# Patient Record
Sex: Female | Born: 1970
Health system: Southern US, Community
[De-identification: ages and names within clinical notes are randomized; demographics above are authoritative.]

## PROBLEM LIST (undated history)

## (undated) DIAGNOSIS — J45909 Unspecified asthma, uncomplicated: Secondary | ICD-10-CM

## (undated) HISTORY — PX: OTHER SURGICAL HISTORY: SHX169

---

## 1998-03-26 ENCOUNTER — Encounter: Admission: RE | Admit: 1998-03-26 | Discharge: 1998-03-26 | Payer: Self-pay | Admitting: Hematology and Oncology

## 1998-05-04 ENCOUNTER — Encounter: Admission: RE | Admit: 1998-05-04 | Discharge: 1998-05-04 | Payer: Self-pay | Admitting: Internal Medicine

## 1999-09-24 ENCOUNTER — Encounter: Admission: RE | Admit: 1999-09-24 | Discharge: 1999-09-24 | Payer: Self-pay | Admitting: Hematology and Oncology

## 2000-10-26 ENCOUNTER — Encounter (INDEPENDENT_AMBULATORY_CARE_PROVIDER_SITE_OTHER): Payer: Self-pay | Admitting: *Deleted

## 2000-10-26 LAB — CONVERTED CEMR LAB

## 2000-10-30 ENCOUNTER — Encounter: Admission: RE | Admit: 2000-10-30 | Discharge: 2000-10-30 | Payer: Self-pay | Admitting: Family Medicine

## 2000-11-13 ENCOUNTER — Encounter: Admission: RE | Admit: 2000-11-13 | Discharge: 2000-11-13 | Payer: Self-pay | Admitting: Family Medicine

## 2001-02-12 ENCOUNTER — Ambulatory Visit (HOSPITAL_COMMUNITY): Admission: RE | Admit: 2001-02-12 | Discharge: 2001-02-12 | Payer: Self-pay | Admitting: *Deleted

## 2001-02-13 ENCOUNTER — Encounter: Admission: RE | Admit: 2001-02-13 | Discharge: 2001-02-13 | Payer: Self-pay | Admitting: Family Medicine

## 2001-03-16 ENCOUNTER — Encounter: Admission: RE | Admit: 2001-03-16 | Discharge: 2001-03-16 | Payer: Self-pay | Admitting: Family Medicine

## 2001-03-30 ENCOUNTER — Encounter: Admission: RE | Admit: 2001-03-30 | Discharge: 2001-03-30 | Payer: Self-pay | Admitting: Family Medicine

## 2001-04-02 ENCOUNTER — Encounter: Payer: Self-pay | Admitting: Surgical Oncology

## 2001-04-02 ENCOUNTER — Ambulatory Visit (HOSPITAL_COMMUNITY): Admission: RE | Admit: 2001-04-02 | Discharge: 2001-04-02 | Payer: Self-pay | Admitting: Surgical Oncology

## 2001-04-24 ENCOUNTER — Encounter: Admission: RE | Admit: 2001-04-24 | Discharge: 2001-04-24 | Payer: Self-pay | Admitting: Family Medicine

## 2003-10-16 ENCOUNTER — Encounter: Admission: RE | Admit: 2003-10-16 | Discharge: 2003-10-16 | Payer: Self-pay | Admitting: Sports Medicine

## 2004-08-19 ENCOUNTER — Emergency Department (HOSPITAL_COMMUNITY): Admission: EM | Admit: 2004-08-19 | Discharge: 2004-08-19 | Payer: Self-pay | Admitting: Emergency Medicine

## 2004-11-25 ENCOUNTER — Emergency Department (HOSPITAL_COMMUNITY): Admission: EM | Admit: 2004-11-25 | Discharge: 2004-11-25 | Payer: Self-pay | Admitting: Family Medicine

## 2005-11-26 ENCOUNTER — Emergency Department (HOSPITAL_COMMUNITY): Admission: EM | Admit: 2005-11-26 | Discharge: 2005-11-26 | Payer: Self-pay | Admitting: Emergency Medicine

## 2006-11-15 ENCOUNTER — Emergency Department (HOSPITAL_COMMUNITY): Admission: EM | Admit: 2006-11-15 | Discharge: 2006-11-15 | Payer: Self-pay | Admitting: Family Medicine

## 2007-02-23 ENCOUNTER — Encounter (INDEPENDENT_AMBULATORY_CARE_PROVIDER_SITE_OTHER): Payer: Self-pay | Admitting: *Deleted

## 2007-07-31 ENCOUNTER — Emergency Department (HOSPITAL_COMMUNITY): Admission: EM | Admit: 2007-07-31 | Discharge: 2007-07-31 | Payer: Self-pay | Admitting: Emergency Medicine

## 2007-09-21 ENCOUNTER — Emergency Department (HOSPITAL_COMMUNITY): Admission: EM | Admit: 2007-09-21 | Discharge: 2007-09-22 | Payer: Self-pay | Admitting: Emergency Medicine

## 2008-03-14 ENCOUNTER — Inpatient Hospital Stay (HOSPITAL_COMMUNITY): Admission: RE | Admit: 2008-03-14 | Discharge: 2008-03-14 | Payer: Self-pay | Admitting: Obstetrics and Gynecology

## 2011-09-19 LAB — POCT PREGNANCY, URINE
Operator id: 223731
Preg Test, Ur: NEGATIVE

## 2011-09-19 LAB — URINALYSIS, ROUTINE W REFLEX MICROSCOPIC
Nitrite: NEGATIVE
Specific Gravity, Urine: 1.025
Urobilinogen, UA: 0.2
pH: 5.5

## 2011-09-19 LAB — URINE MICROSCOPIC-ADD ON

## 2011-09-19 LAB — GC/CHLAMYDIA PROBE AMP, GENITAL
Chlamydia, DNA Probe: NEGATIVE
GC Probe Amp, Genital: NEGATIVE

## 2011-09-19 LAB — WET PREP, GENITAL

## 2011-10-06 LAB — BASIC METABOLIC PANEL
BUN: 12
Calcium: 9.3
GFR calc non Af Amer: >60
Potassium: 3.7
Sodium: 135

## 2011-10-06 LAB — URINE MICROSCOPIC-ADD ON

## 2011-10-06 LAB — URINALYSIS, ROUTINE W REFLEX MICROSCOPIC
Glucose, UA: NEGATIVE
Ketones, ur: NEGATIVE
Leukocytes, UA: NEGATIVE
Nitrite: NEGATIVE
Specific Gravity, Urine: 1.016
pH: 7

## 2011-10-06 LAB — CBC
HCT: 42.6
Hemoglobin: 14.6
Platelets: 161
RDW: 13.3
WBC: 10.4

## 2011-10-06 LAB — DIFFERENTIAL
Basophils Absolute: 0.1
Eosinophils Relative: 1
Lymphocytes Relative: 23
Lymphs Abs: 2.4
Neutro Abs: 7

## 2011-10-06 LAB — RAPID URINE DRUG SCREEN, HOSP PERFORMED: Tetrahydrocannabinol: POSITIVE — AB

## 2011-10-06 LAB — GC/CHLAMYDIA PROBE AMP, GENITAL: GC Probe Amp, Genital: NEGATIVE

## 2011-10-06 LAB — POCT PREGNANCY, URINE
Operator id: 291361
Preg Test, Ur: NEGATIVE

## 2011-10-06 LAB — ETHANOL: Alcohol, Ethyl (B): <5

## 2012-03-31 ENCOUNTER — Encounter (HOSPITAL_COMMUNITY): Payer: Self-pay | Admitting: Physical Medicine and Rehabilitation

## 2012-03-31 ENCOUNTER — Emergency Department (HOSPITAL_COMMUNITY)
Admission: EM | Admit: 2012-03-31 | Discharge: 2012-03-31 | Disposition: A | Discharge status: Home / Self Care | Payer: Self-pay | Attending: Emergency Medicine | Admitting: Emergency Medicine

## 2012-03-31 DIAGNOSIS — F17200 Nicotine dependence, unspecified, uncomplicated: Secondary | ICD-10-CM | POA: Insufficient documentation

## 2012-03-31 DIAGNOSIS — L738 Other specified follicular disorders: Secondary | ICD-10-CM | POA: Insufficient documentation

## 2012-03-31 DIAGNOSIS — L739 Follicular disorder, unspecified: Secondary | ICD-10-CM

## 2012-03-31 LAB — URINALYSIS, ROUTINE W REFLEX MICROSCOPIC
Glucose, UA: NEGATIVE mg/dL
Ketones, ur: NEGATIVE mg/dL
Leukocytes, UA: NEGATIVE
Nitrite: NEGATIVE
Specific Gravity, Urine: 1.023 (ref 1.005–1.030)
pH: 5.5 (ref 5.0–8.0)

## 2012-03-31 MED ORDER — CEPHALEXIN 500 MG PO CAPS: 500.0000 mg | ORAL_CAPSULE | Freq: Four times a day (QID) | ORAL | Status: AC

## 2012-03-31 MED ORDER — TRAMADOL HCL 50 MG PO TABS: 50.0000 mg | ORAL_TABLET | Freq: Four times a day (QID) | ORAL | Status: AC | PRN

## 2012-03-31 MED ORDER — LIDOCAINE HCL (PF) 1 % IJ SOLN
INTRAMUSCULAR | Status: AC
  Administered 2012-03-31: 5 mL
  Filled 2012-03-31: qty 5

## 2012-03-31 NOTE — ED Provider Notes (Signed)
History     CSN: [id number]  Arrival date & time 03/31/12  1548   First MD Initiated Contact with Patient 03/31/12 1634      Chief Complaint  Patient presents with  . Abscess    (Consider location/radiation/quality/duration/timing/severity/associated sxs/prior treatment) Patient is a 41 y.o. female presenting with abscess. The history is provided by the patient.  Abscess  This is a new problem. The current episode started yesterday. The problem has been gradually worsening. Pertinent negatives include no fever. Associated symptoms comments: She noticed a tender, painful swelling to the left scalp that drained a small amount of pus. She states the pain radiated from that area to the posterior scalp, the ear and the jaw. No fever, redness. . There were no sick contacts. She has received no recent medical care.    No past medical history on file.  No past surgical history on file.  No family history on file.  History  Substance Use Topics  . Smoking status: Current Everyday Smoker    Types: Cigarettes  . Smokeless tobacco: Not on file  . Alcohol Use: No    OB History    Grav Para Term Preterm Abortions TAB SAB Ect Mult Living                  Review of Systems  Constitutional: Negative for fever and chills.  HENT: Negative.   Respiratory: Negative.   Cardiovascular: Negative.   Gastrointestinal: Negative.  Negative for nausea.  Musculoskeletal: Negative.   Skin:       C/O Abscess.  Neurological: Negative.     Allergies  Aspirin  Home Medications  No current outpatient prescriptions on file.  BP 133/82  Pulse 81  Temp(Src) 98.7 F (37.1 C) (Oral)  Resp 20  SpO2 97%  Physical Exam  Constitutional: She is oriented to person, place, and time. She appears well-developed and well-nourished.  HENT:  Right Ear: External ear normal.  Left Ear: External ear normal.  Mouth/Throat: Oropharynx is clear and moist.  Eyes: Conjunctivae are normal.  Neck: Normal  range of motion.  Pulmonary/Chest: Effort normal.  Lymphadenopathy:    She has no cervical adenopathy.  Neurological: She is alert and oriented to person, place, and time.  Skin: Skin is warm and dry.       Small nodular swelling at the hair follicle. No redness or drainage. Remainder scalp appears unremarkable and is not tender to palpation.     ED Course  Procedures (including critical care time) INCISION AND DRAINAGE Performed by: Langley Adie A Consent: Verbal consent obtained. Risks and benefits: risks, benefits and alternatives were discussed Type: abscess  Body area: left scalp  Anesthesia: local infiltration  Local anesthetic: lidocaine 1% w/o epinephrine  Anesthetic total: 1/2 ml  Complexity: complex Blunt dissection to break up loculations  Drainage: purulent  Drainage amount: none: 18G needle used to attempt aspiration without purulent return.  Packing material:   Patient tolerance: Patient tolerated the procedure well with no immediate complications.     Labs Reviewed  URINALYSIS, ROUTINE W REFLEX MICROSCOPIC   No results found.   No diagnosis found. 1. Folliculitis   MDM          Rodena Medin, PA-C 03/31/12 1835

## 2012-03-31 NOTE — Discharge Instructions (Signed)
WARM COMPRESSES TO THE SORE AREA. TAKE MEDICATIONS AS PRESCRIBED. RETURN HERE WITH ANY WORSENING SYMPTOMS OR NEW CONCERNS.  Folliculitis  Folliculitis is an infection and inflammation of the hair follicles. Hair follicles become red and irritated. This inflammation is usually caused by bacteria. The bacteria thrive in warm, moist environments. This condition can be seen anywhere on the body.  CAUSES The most common cause of folliculitis is an infection by germs (bacteria). Fungal and viral infections can also cause the condition. Viral infections may be more common in people whose bodies are unable to fight disease well (weakened immune systems). Examples include people with:  AIDS.   An organ transplant.   Cancer.  People with depressed immune systems, diabetes, or obesity, have a greater risk of getting folliculitis than the general population. Certain chemicals, especially oils and tars, also can cause folliculitis. SYMPTOMS  An early sign of folliculitis is a small, white or yellow pus-filled, itchy lesion (pustule). These lesions appear on a red, inflamed follicle. They are usually less than 5 mm (.20 inches).   The most likely starting points are the scalp, thighs, legs, back and buttocks. Folliculitis is also frequently found in areas of repeated shaving.   When an infection of the follicle goes deeper, it becomes a boil or furuncle. A group of closely packed boils create a larger lesion (a carbuncle). These sores (lesions) tend to occur in hairy, sweaty areas of the body.  TREATMENT   A doctor who specializes in skin problems (dermatologists) treats mild cases of folliculitis with antiseptic washes.   They also use a skin application which kills germs (topical antibiotics). Tea tree oil is a good topical antiseptic as well. It can be found at a health food store. A small percentage of individuals may develop an allergy to the tea tree oil.   Mild to moderate boils respond well to  warm water compresses applied three times daily.   In some cases, oral antibiotics should be taken with the skin treatment.   If lesions contain large quantities of pus or fluid, your caregiver may drain them. This allows the topical antibiotics to get to the affected areas better.   Stubborn cases of folliculitis may respond to laser hair removal. This process uses a high intensity light beam (a laser) to destroy the follicle and reduces the scarring from folliculitis. After laser hair removal, hair will no longer grow in the laser treated area.  Patients with long-lasting folliculitis need to find out where the infection is coming from. Germs can live in the nostrils of the patient. This can trigger an outbreak now and then. Sometimes the bacteria live in the nostrils of a family member. This person does not develop the disorder but they repeatedly re-expose others to the germ. To break the cycle of recurrence in the patient, the family member must also undergo treatment. PREVENTION   Individuals who are predisposed to folliculitis should be extremely careful about personal hygiene.   Application of antiseptic washes may help prevent recurrences.   A topical antibiotic cream, mupirocin (Bactroban), has been effective at reducing bacteria in the nostrils. It is applied inside the nose with your little finger. This is done twice daily for a week. Then it is repeated every 6 months.   Because follicle disorders tend to come back, patients must receive follow-up care. Your caregiver may be able to recognize a recurrence before it becomes severe.  SEEK IMMEDIATE MEDICAL CARE IF:   You develop redness, swelling, or  increasing pain in the area.   You have a fever.   You are not improving with treatment or are getting worse.   You have any other questions or concerns.  Document Released: 02/20/2002 Document Revised: 12/01/2011 Document Reviewed: 12/17/2008 [facility] Patient Information 2012  [place], [place].

## 2012-03-31 NOTE — ED Notes (Signed)
Pt presents to department for evaluation of possible abscess to scalp. Onset x2 weeks ago. Pt states small raised painful area to scalp. No relief of pain at home. No drainage noted. She is alert and oriented x4. 8/10 pain upon arrival to ED.

## 2012-04-01 NOTE — ED Provider Notes (Signed)
Medical screening examination/treatment/procedure(s) were performed by non-physician practitioner and as supervising physician I was immediately available for consultation/collaboration.  Doug Sou, MD 04/01/12 0157

## 2013-04-06 ENCOUNTER — Emergency Department (HOSPITAL_COMMUNITY)
Admission: EM | Admit: 2013-04-06 | Discharge: 2013-04-06 | Disposition: A | Discharge status: Home / Self Care | Payer: Self-pay | Attending: Emergency Medicine | Admitting: Emergency Medicine

## 2013-04-06 ENCOUNTER — Emergency Department (HOSPITAL_COMMUNITY): Payer: Self-pay

## 2013-04-06 ENCOUNTER — Encounter (HOSPITAL_COMMUNITY): Payer: Self-pay | Admitting: *Deleted

## 2013-04-06 DIAGNOSIS — F1910 Other psychoactive substance abuse, uncomplicated: Secondary | ICD-10-CM

## 2013-04-06 DIAGNOSIS — F17200 Nicotine dependence, unspecified, uncomplicated: Secondary | ICD-10-CM | POA: Insufficient documentation

## 2013-04-06 DIAGNOSIS — S199XXA Unspecified injury of neck, initial encounter: Secondary | ICD-10-CM | POA: Insufficient documentation

## 2013-04-06 DIAGNOSIS — Y929 Unspecified place or not applicable: Secondary | ICD-10-CM | POA: Insufficient documentation

## 2013-04-06 DIAGNOSIS — W19XXXA Unspecified fall, initial encounter: Secondary | ICD-10-CM

## 2013-04-06 DIAGNOSIS — R296 Repeated falls: Secondary | ICD-10-CM | POA: Insufficient documentation

## 2013-04-06 DIAGNOSIS — Y939 Activity, unspecified: Secondary | ICD-10-CM | POA: Insufficient documentation

## 2013-04-06 DIAGNOSIS — S0993XA Unspecified injury of face, initial encounter: Secondary | ICD-10-CM | POA: Insufficient documentation

## 2013-04-06 DIAGNOSIS — Z3202 Encounter for pregnancy test, result negative: Secondary | ICD-10-CM | POA: Insufficient documentation

## 2013-04-06 LAB — RAPID URINE DRUG SCREEN, HOSP PERFORMED
Barbiturates: NOT DETECTED
Benzodiazepines: NOT DETECTED
Cocaine: POSITIVE — AB
Opiates: NOT DETECTED

## 2013-04-06 LAB — PREGNANCY, URINE: Preg Test, Ur: NEGATIVE

## 2013-04-06 MED ORDER — ONDANSETRON HCL 4 MG/2ML IJ SOLN
4.0000 mg | Freq: Once | INTRAMUSCULAR | Status: AC
  Administered 2013-04-06: 4 mg via INTRAVENOUS
  Filled 2013-04-06: qty 2

## 2013-04-06 MED ORDER — PANTOPRAZOLE SODIUM 40 MG IV SOLR
40.0000 mg | Freq: Once | INTRAVENOUS | Status: AC
  Administered 2013-04-06: 40 mg via INTRAVENOUS
  Filled 2013-04-06: qty 40

## 2013-04-06 MED ORDER — SODIUM CHLORIDE 0.9 % IV BOLUS (SEPSIS)
1000.0000 mL | Freq: Once | INTRAVENOUS | Status: AC
  Administered 2013-04-06: 1000 mL via INTRAVENOUS
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
## 2013-04-06 NOTE — ED Notes (Signed)
Ice pack applied to left sided of face.
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
## 2014-03-12 ENCOUNTER — Encounter (HOSPITAL_COMMUNITY): Payer: Self-pay | Admitting: Emergency Medicine

## 2014-03-12 DIAGNOSIS — F17200 Nicotine dependence, unspecified, uncomplicated: Secondary | ICD-10-CM | POA: Insufficient documentation

## 2014-03-12 DIAGNOSIS — S79929A Unspecified injury of unspecified thigh, initial encounter: Secondary | ICD-10-CM

## 2014-03-12 DIAGNOSIS — S1093XA Contusion of unspecified part of neck, initial encounter: Principal | ICD-10-CM

## 2014-03-12 DIAGNOSIS — J45909 Unspecified asthma, uncomplicated: Secondary | ICD-10-CM | POA: Insufficient documentation

## 2014-03-12 DIAGNOSIS — S0003XA Contusion of scalp, initial encounter: Secondary | ICD-10-CM | POA: Insufficient documentation

## 2014-03-12 DIAGNOSIS — S79919A Unspecified injury of unspecified hip, initial encounter: Secondary | ICD-10-CM | POA: Insufficient documentation

## 2014-03-12 DIAGNOSIS — S0083XA Contusion of other part of head, initial encounter: Principal | ICD-10-CM
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
## 2014-03-13 ENCOUNTER — Emergency Department (HOSPITAL_COMMUNITY)
Admission: EM | Admit: 2014-03-13 | Discharge: 2014-03-13 | Disposition: A | Discharge status: Home / Self Care | Payer: Self-pay | Attending: Emergency Medicine | Admitting: Emergency Medicine

## 2014-03-13 ENCOUNTER — Emergency Department (HOSPITAL_COMMUNITY): Payer: Self-pay

## 2014-03-13 DIAGNOSIS — S0083XA Contusion of other part of head, initial encounter: Secondary | ICD-10-CM

## 2014-03-13 HISTORY — DX: Unspecified asthma, uncomplicated: J45.909

## 2014-03-13 MED ORDER — IBUPROFEN 800 MG PO TABS
800.0000 mg | ORAL_TABLET | Freq: Once | ORAL | Status: AC
  Administered 2014-03-13: 800 mg via ORAL
  Filled 2014-03-13: qty 1
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
## 2014-03-13 NOTE — ED Notes (Signed)
Ice pack given for forehead

## 2014-03-13 NOTE — Discharge Instructions (Signed)
Your CAT scans today did not show any fractures in your face.  Tylenol or Motrin for pain.  Ice will help with swelling.  Symptoms will improve over the next 5-7 days.   Facial or Scalp Contusion A facial or scalp contusion is a deep bruise on the face or head. Injuries to the face and head generally cause a lot of swelling, especially around the eyes. Contusions are the result of an injury that caused bleeding under the skin. The contusion may turn blue, purple, or yellow. Minor injuries will give you a painless contusion, but more severe contusions may stay painful and swollen for a few weeks.  CAUSES  A facial or scalp contusion is caused by a blunt injury or trauma to the face or head area.  SIGNS AND SYMPTOMS   Swelling of the injured area.   Discoloration of the injured area.   Tenderness, soreness, or pain in the injured area.  DIAGNOSIS  The diagnosis can be made by taking a medical history and doing a physical exam. An X-ray exam, CT scan, or MRI may be needed to determine if there are any associated injuries, such as broken bones (fractures). TREATMENT  Often, the best treatment for a facial or scalp contusion is applying cold compresses to the injured area. Over-the-counter medicines may also be recommended for pain control.  HOME CARE INSTRUCTIONS   Only take over-the-counter or prescription medicines as directed by your health care provider.   Apply ice to the injured area.   Put ice in a plastic bag.   Place a towel between your skin and the bag.   Leave the ice on for 20 minutes, 2 3 times a day.  SEEK MEDICAL CARE IF:  You have bite problems.   You have pain with chewing.   You are concerned about facial defects. SEEK IMMEDIATE MEDICAL CARE IF:  You have severe pain or a headache that is not relieved by medicine.   You have unusual sleepiness, confusion, or personality changes.   You throw up (vomit).   You have a persistent nosebleed.   You  have double vision or blurred vision.   You have fluid drainage from your nose or ear.   You have difficulty walking or using your arms or legs.  MAKE SURE YOU:   Understand these instructions.  Will watch your condition.  Will get help right away if you are not doing well or get worse. Document Released: 01/19/2005 Document Revised: 10/02/2013 Document Reviewed: 07/25/2013 [facility] Patient Information 2014 [place], [place].

## 2014-04-11 ENCOUNTER — Emergency Department (HOSPITAL_COMMUNITY)
Admission: EM | Admit: 2014-04-11 | Discharge: 2014-04-11 | Disposition: A | Discharge status: Home / Self Care | Payer: Self-pay | Attending: Emergency Medicine | Admitting: Emergency Medicine

## 2014-04-11 ENCOUNTER — Encounter (HOSPITAL_COMMUNITY): Payer: Self-pay | Admitting: Emergency Medicine

## 2014-04-11 DIAGNOSIS — W010XXA Fall on same level from slipping, tripping and stumbling without subsequent striking against object, initial encounter: Secondary | ICD-10-CM | POA: Insufficient documentation

## 2014-04-11 DIAGNOSIS — S0083XA Contusion of other part of head, initial encounter: Secondary | ICD-10-CM | POA: Insufficient documentation

## 2014-04-11 DIAGNOSIS — S0191XA Laceration without foreign body of unspecified part of head, initial encounter: Secondary | ICD-10-CM

## 2014-04-11 DIAGNOSIS — T148XXA Other injury of unspecified body region, initial encounter: Secondary | ICD-10-CM

## 2014-04-11 DIAGNOSIS — J45909 Unspecified asthma, uncomplicated: Secondary | ICD-10-CM | POA: Insufficient documentation

## 2014-04-11 DIAGNOSIS — S1093XA Contusion of unspecified part of neck, initial encounter: Secondary | ICD-10-CM

## 2014-04-11 DIAGNOSIS — W19XXXA Unspecified fall, initial encounter: Secondary | ICD-10-CM

## 2014-04-11 DIAGNOSIS — S0180XA Unspecified open wound of other part of head, initial encounter: Secondary | ICD-10-CM | POA: Insufficient documentation

## 2014-04-11 DIAGNOSIS — W1809XA Striking against other object with subsequent fall, initial encounter: Secondary | ICD-10-CM | POA: Insufficient documentation

## 2014-04-11 DIAGNOSIS — Y9289 Other specified places as the place of occurrence of the external cause: Secondary | ICD-10-CM | POA: Insufficient documentation

## 2014-04-11 DIAGNOSIS — Y9301 Activity, walking, marching and hiking: Secondary | ICD-10-CM | POA: Insufficient documentation

## 2014-04-11 DIAGNOSIS — S0003XA Contusion of scalp, initial encounter: Secondary | ICD-10-CM | POA: Insufficient documentation

## 2014-04-11 DIAGNOSIS — F17200 Nicotine dependence, unspecified, uncomplicated: Secondary | ICD-10-CM | POA: Insufficient documentation

## 2014-04-11 MED ORDER — HYDROCODONE-ACETAMINOPHEN 5-325 MG PO TABS: 1.0000 | ORAL_TABLET | ORAL | Status: DC | PRN

## 2014-04-11 MED ORDER — ACETAMINOPHEN 325 MG PO TABS
650.0000 mg | ORAL_TABLET | Freq: Once | ORAL | Status: AC
  Administered 2014-04-11: 650 mg via ORAL
  Filled 2014-04-11: qty 2

## 2014-04-11 NOTE — Discharge Instructions (Signed)
WOUND CARE Please have your stitches/staples removed in 5-7 days or sooner if you have concerns. You may do this at any available urgent care or at your primary care doctor's office.  Keep area clean and dry for 24 hours. Do not remove bandage, if applied.  After 24 hours, remove bandage and wash wound gently with mild soap and warm water. Reapply a new bandage after cleaning wound, if directed.  Continue daily cleansing with soap and water until stitches/staples are removed.  Do not apply any ointments or creams to the wound while stitches/staples are in place, as this may cause delayed healing.  Seek medical careif you experience any of the following signs of infection: Swelling, redness, pus drainage, streaking, fever >101.0 F  Seek care if you experience excessive bleeding that does not stop after 15-20 minutes of constant, firm pressure.   Facial Laceration  A facial laceration is a cut on the face. These injuries can be painful and cause bleeding. Lacerations usually heal quickly, but they need special care to reduce scarring. DIAGNOSIS  Your health care provider will take a medical history, ask for details about how the injury occurred, and examine the wound to determine how deep the cut is. TREATMENT  Some facial lacerations may not require closure. Others may not be able to be closed because of an increased risk of infection. The risk of infection and the chance for successful closure will depend on various factors, including the amount of time since the injury occurred. The wound may be cleaned to help prevent infection. If closure is appropriate, pain medicines may be given if needed. Your health care provider will use stitches (sutures), wound glue (adhesive), or skin adhesive strips to repair the laceration. These tools bring the skin edges together to allow for faster healing and a better cosmetic outcome. If needed, you may also be given a tetanus shot. HOME CARE  INSTRUCTIONS  Only take over-the-counter or prescription medicines as directed by your health care provider.  Follow your health care provider's instructions for wound care. These instructions will vary depending on the technique used for closing the wound. For Sutures:  Keep the wound clean and dry.   If you were given a bandage (dressing), you should change it at least once a day. Also change the dressing if it becomes wet or dirty, or as directed by your health care provider.   Wash the wound with soap and water 2 times a day. Rinse the wound off with water to remove all soap. Pat the wound dry with a clean towel.   After cleaning, apply a thin layer of the antibiotic ointment recommended by your health care provider. This will help prevent infection and keep the dressing from sticking.   You may shower as usual after the first 24 hours. Do not soak the wound in water until the sutures are removed.   Get your sutures removed as directed by your health care provider. With facial lacerations, sutures should usually be taken out after 4 5 days to avoid stitch marks.   Wait a few days after your sutures are removed before applying any makeup. For Skin Adhesive Strips:  Keep the wound clean and dry.   Do not get the skin adhesive strips wet. You may bathe carefully, using caution to keep the wound dry.   If the wound gets wet, pat it dry with a clean towel.   Skin adhesive strips will fall off on their own. You may trim the  strips as the wound heals. Do not remove skin adhesive strips that are still stuck to the wound. They will fall off in time.  For Wound Adhesive:  You may briefly wet your wound in the shower or bath. Do not soak or scrub the wound. Do not swim. Avoid periods of heavy sweating until the skin adhesive has fallen off on its own. After showering or bathing, gently pat the wound dry with a clean towel.   Do not apply liquid medicine, cream medicine, ointment  medicine, or makeup to your wound while the skin adhesive is in place. This may loosen the film before your wound is healed.   If a dressing is placed over the wound, be careful not to apply tape directly over the skin adhesive. This may cause the adhesive to be pulled off before the wound is healed.   Avoid prolonged exposure to sunlight or tanning lamps while the skin adhesive is in place.  The skin adhesive will usually remain in place for 5 10 days, then naturally fall off the skin. Do not pick at the adhesive film.  After Healing: Once the wound has healed, cover the wound with sunscreen during the day for 1 full year. This can help minimize scarring. Exposure to ultraviolet light in the first year will darken the scar. It can take 1 2 years for the scar to lose its redness and to heal completely.  SEEK IMMEDIATE MEDICAL CARE IF:  You have redness, pain, or swelling around the wound.   You see ayellowish-white fluid (pus) coming from the wound.   You have chills or a fever.  MAKE SURE YOU:  Understand these instructions.  Will watch your condition.  Will get help right away if you are not doing well or get worse. Document Released: 01/19/2005 Document Revised: 10/02/2013 Document Reviewed: 07/25/2013 [facility] Patient Information 2014 [place], [place]. Scar Minimization You will have a scar anytime you have surgery and a cut is made in the skin or you have something removed from your skin (mole, skin cancer, cyst). Although scars are unavoidable following surgery, there are ways to minimize their appearance. It is important to follow all the instructions you receive from your caregiver about wound care. How your wound heals will influence the appearance of your scar. If you do not follow the wound care instructions as directed, complications such as infection may occur. Wound instructions include keeping the wound clean, moist, and not letting the wound form a scab. Some people  form scars that are raised and lumpy (hypertrophic) or larger than the initial wound (keloidal). HOME CARE INSTRUCTIONS   Follow wound care instructions as directed.  Keep the wound clean by washing it with soap and water.  Keep the wound moist with provided antibiotic cream or petroleum jelly until completely healed. Moisten twice a day for about 2 weeks.  Get stitches (sutures) taken out at the scheduled time.  Avoid touching or manipulating your wound unless needed. Wash your hands thoroughly before and after touching your wound.  Follow all restrictions such as limits on exercise or work. This depends on where your scar is located.  Keep the scar protected from sunburn. Cover the scar with sunscreen/sunblock with SPF 30 or higher.  Gently massage the scar using a circular motion to help minimize the appearance of the scar. Do this only after the wound has closed and all the sutures have been removed.  For hypertrophic or keloidal scars, there are several ways to treat and  minimize their appearance. Methods include compression therapy, intralesional corticosteroids, laser therapy, or surgery. These methods are performed by your caregiver. Remember that the scar may appear lighter or darker than your normal skin color. This difference in color should even out with time. SEEK MEDICAL CARE IF:   You have a fever.  You develop signs of infection such as pain, redness, pus, and warmth.  You have questions or concerns. Document Released: 06/01/2010 Document Revised: 03/05/2012 Document Reviewed: 06/01/2010 [facility] Patient Information 2014 [place], [place]. Acetaminophen; Hydrocodone tablets or capsules What is this medicine? ACETAMINOPHEN; HYDROCODONE (a set a MEE noe fen; hye droe KOE done) is a pain reliever. It is used to treat mild to moderate pain. This medicine may be used for other purposes; ask your health care provider or pharmacist if you have questions. COMMON BRAND NAME(S):  Anexsia, Bancap HC , Ceta-Plus, Co-Gesic, Comfortpak , Dolagesic, [place], [address]/[employer] , [address] , Hydrogesic, Lorcet HD, Lorcet Plus, Lorcet, [place], Margesic H, Maxidone, [place], Polygesic, [place], [place], Vicodin ES, Vicodin HP, Vicodin, [place] Baseman What should I tell my health care provider before I take this medicine? They need to know if you have any of these conditions: -brain tumor -Crohn's disease, inflammatory bowel disease, or ulcerative colitis -drug abuse or addiction -head injury -heart or circulation problems -if you often drink alcohol -kidney disease or problems going to the bathroom -liver disease -lung disease, asthma, or breathing problems -an unusual or allergic reaction to acetaminophen, hydrocodone, other opioid analgesics, other medicines, foods, dyes, or preservatives -pregnant or trying to get pregnant -breast-feeding How should I use this medicine? Take this medicine by mouth. Swallow it with a full glass of water. Follow the directions on the prescription label. If the medicine upsets your stomach, take the medicine with food or milk. Do not take more than you are told to take. Talk to your pediatrician regarding the use of this medicine in children. This medicine is not approved for use in children. Overdosage: If you think you have taken too much of this medicine contact a poison control center or emergency room at once. NOTE: This medicine is only for you. Do not share this medicine with others. What if I miss a dose? If you miss a dose, take it as soon as you can. If it is almost time for your next dose, take only that dose. Do not take double or extra doses. What may interact with this medicine? -alcohol -antihistamines -isoniazid -medicines for depression, anxiety, or psychotic disturbances -medicines for sleep -muscle relaxants -naltrexone -narcotic medicines (opiates) for pain -phenobarbital -ritonavir -tramadol This list may not describe  all possible interactions. Give your health care provider a list of all the medicines, herbs, non-prescription drugs, or dietary supplements you use. Also tell them if you smoke, drink alcohol, or use illegal drugs. Some items may interact with your medicine. What should I watch for while using this medicine? Tell your doctor or health care professional if your pain does not go away, if it gets worse, or if you have new or a different type of pain. You may develop tolerance to the medicine. Tolerance means that you will need a higher dose of the medicine for pain relief. Tolerance is normal and is expected if you take the medicine for a long time. Do not suddenly stop taking your medicine because you may develop a severe reaction. Your body becomes used to the medicine. This does NOT mean you are addicted. Addiction is a behavior related to getting  and using a drug for a non-medical reason. If you have pain, you have a medical reason to take pain medicine. Your doctor will tell you how much medicine to take. If your doctor wants you to stop the medicine, the dose will be slowly lowered over time to avoid any side effects. You may get drowsy or dizzy when you first start taking the medicine or change doses. Do not drive, use machinery, or do anything that may be dangerous until you know how the medicine affects you. Stand or sit up slowly. There are different types of narcotic medicines (opiates) for pain. If you take more than one type at the same time, you may have more side effects. Give your health care provider a list of all medicines you use. Your doctor will tell you how much medicine to take. Do not take more medicine than directed. Call emergency for help if you have problems breathing. The medicine will cause constipation. Try to have a bowel movement at least every 2 to 3 days. If you do not have a bowel movement for 3 days, call your doctor or health care professional. Too much acetaminophen can be  very dangerous. Do not take Tylenol (acetaminophen) or medicines that contain acetaminophen with this medicine. Many non-prescription medicines contain acetaminophen. Always read the labels carefully. What side effects may I notice from receiving this medicine? Side effects that you should report to your doctor or health care professional as soon as possible: -allergic reactions like skin rash, itching or hives, swelling of the face, lips, or tongue -breathing problems -confusion -feeling faint or lightheaded, falls -stomach pain -yellowing of the eyes or skin Side effects that usually do not require medical attention (report to your doctor or health care professional if they continue or are bothersome): -nausea, vomiting -stomach upset This list may not describe all possible side effects. Call your doctor for medical advice about side effects. You may report side effects to FDA at 1-800-FDA-1088. Where should I keep my medicine? Keep out of the reach of children. This medicine can be abused. Keep your medicine in a safe place to protect it from theft. Do not share this medicine with anyone. Selling or giving away this medicine is dangerous and against the law. Store at room temperature between 15 and 30 degrees C (59 and 86 degrees F). Protect from light. Keep container tightly closed.  Throw away any unused medicine after the expiration date. Discard unused medicine and used packaging carefully. Pets and children can be harmed if they find used or lost packages. NOTE: This sheet is a summary. It may not cover all possible information. If you have questions about this medicine, talk to your doctor, pharmacist, or health care provider.  2014, Elsevier/Gold Standard. (2013-08-05 13:15:56)

## 2014-04-11 NOTE — ED Notes (Signed)
Per EMS, Patient was walking to the store this morning and [place]. Patient reports hitting head on concrete with a left sided laceration about 1.5 inches in length and two superficial abrasions on the right side. Patient denies any other injuries. Patient complains of headache and denies back or neck pain.

## 2014-04-11 NOTE — ED Notes (Signed)
PA Abigail at the bedside.  

## 2014-04-11 NOTE — ED Notes (Signed)
Vitals per EMS: 160/100, 100 HR, 18 RR, Patient Refused CBG.

## 2014-04-11 NOTE — ED Provider Notes (Signed)
CSN: [id number]     Arrival date & time 04/11/14  0759 History   First MD Initiated Contact with Patient 04/11/14 [phone]     Chief Complaint  Patient presents with  . Fall     (Consider location/radiation/quality/duration/timing/severity/associated sxs/prior Treatment) HPI Emily Whitehead 43 year old female who presents the emergency department chief complaint of fall and head laceration.  Patient states she was walking to the store this morning to get breakfast when she slipped on the wet road causing her to fall and hit the left side of her head on the curb.  Patient states she "saw stars" for us brief moment however her vision is normal at this time and that only lasted a few seconds.  She has a complaint of a mild headache.  She denies pain with eye movement.  Patient is up-to-date on her tetanus vaccination.  She states she got about 3 years ago here in the emergency department.  Denies injury to mouth, denies any other injuries. Denies unilateral weakness, facial asymmetry, difficulty with speech, change in gait, or vertigo.    Past Medical History  Diagnosis Date  . Asthma    Past Surgical History  Procedure Laterality Date  . Dnc     No family history on file. History  Substance Use Topics  . Smoking status: Current Every Day Smoker -- 0.25 packs/day    Types: Cigarettes  . Smokeless tobacco: Never Used  . Alcohol Use: No   OB History   Grav Para Term Preterm Abortions TAB SAB Ect Mult Living                 Review of Systems  Ten systems reviewed and are negative for acute change, except as noted in the HPI.    Allergies  Aspirin  Home Medications   Prior to Admission medications   Not on File   BP 144/80  Pulse 95  Temp(Src) 98.2 F (36.8 C) (Oral)  Resp 18  Wt 228 lb (103.42 kg)  SpO2 97%  LMP 02/12/2014 Physical Exam Physical Exam  Nursing note and vitals reviewed. Constitutional: She is oriented to person, place, and time. She appears  well-developed and well-nourished. No distress.  HENT:  Head: Normocephalic 2 cm hematoma on the left frontal region, one and a half centimeter laceration in lateral to the left eyebrow.  Some abrasion over the right eyebrow. Eyes: Conjunctivae normal and EOM are normal. Pupils are equal, round, and reactive to light. No scleral icterus.  Neck: Normal range of motion.  Cardiovascular: Normal rate, regular rhythm and normal heart sounds.  Exam reveals no gallop and no friction rub.   No murmur heard. Pulmonary/Chest: Effort normal and breath sounds normal. No respiratory distress.  Abdominal: Soft. Bowel sounds are normal. She exhibits no distension and no mass. There is no tenderness. There is no guarding.  Neurological: She is alert and oriented to person, place, and time.  Speech is clear and goal oriented, follows commands Major Cranial nerves without deficit, no facial droop Normal strength in upper and lower extremities bilaterally including dorsiflexion and plantar flexion, strong and equal grip strength Sensation normal to light and sharp touch Moves extremities without ataxia, coordination intact Normal finger to nose and rapid alternating movements Neg romberg, no pronator drift Normal gait Skin: Skin is warm and dry. She is not diaphoretic.    ED Course  Procedures (including critical care time) Labs Review Labs Reviewed - No data to display  Imaging Review No results found.  EKG Interpretation None      LACERATION REPAIR Performed by: Arthor CaptainAbigail Rayvon Brandvold Authorized by: Arthor CaptainAbigail Kenzly Rogoff Consent: Verbal consent obtained. Risks and benefits: risks, benefits and alternatives were discussed Consent given by: patient Patient identity confirmed: provided demographic data Prepped and Draped in normal sterile fashion Wound explored  Laceration Location: Left temporal region  Laceration Length: 1.5 cm  No Foreign Bodies seen or palpated  Anesthesia: local  infiltration  Local anesthetic: lidocaine 2 % w epinephrine  Anesthetic total: 4 ml  Irrigation method: syringe Amount of cleaning: standard  Skin closure: 6.0 prolene  Number of sutures: 6  Technique: running interlocking  Patient tolerance: Patient tolerated the procedure well with no immediate complications.  MDM   Final diagnoses:  Fall  Laceration of head  Hematoma    8:19 AM Filed Vitals:   04/11/14 0805  BP: 144/80  Pulse: 95  Temp: 98.2 F (36.8 C)  Resp: 18   Patient here with fall and lacerations, no neurologic deficits.  9:27 AM BP 124/80  Pulse 81  Temp(Src) 98.2 F (36.8 C) (Oral)  Resp 18  Wt 228 lb (103.42 kg)  SpO2 99%  LMP 02/12/2014 MDM Number of Diagnoses or Management Options Fall:  Hematoma:  Laceration of head:  No imaging necessary. No facial deformities, step offs, crepitus.  Pressure irrigation performed. Laceration occurred < 8 hours prior to repair which was well tolerated. Pt has no co morbidities to effect normal wound healing. Discussed suture home care w pt and answered questions. Pt to f-u for wound check and suture removal in 7 days. Pt is hemodynamically stable w no complaints prior to dc.      Arthor CaptainAbigail Brittany Amirault, PA-C 04/11/14 [phone]

## 2014-04-11 NOTE — ED Notes (Signed)
Suture Cart at the bedside.  ?

## 2014-04-11 NOTE — ED Notes (Signed)
ED PA asked for pain medication. Verbalized she would put something in.

## 2014-04-11 NOTE — ED Notes (Signed)
PA Harris at the bedside suturing laceration.

## 2014-04-15 NOTE — ED Provider Notes (Signed)
Medical screening examination/treatment/procedure(s) were performed by non-physician practitioner and as supervising physician I was immediately available for consultation/collaboration.   EKG Interpretation None        Joya Gaskinsonald W Lekeya Rollings, MD 04/15/14 [phone]

## 2014-07-14 ENCOUNTER — Encounter (HOSPITAL_COMMUNITY): Payer: Self-pay | Admitting: Emergency Medicine

## 2014-07-14 DIAGNOSIS — Z791 Long term (current) use of non-steroidal anti-inflammatories (NSAID): Secondary | ICD-10-CM | POA: Insufficient documentation

## 2014-07-14 DIAGNOSIS — Z3202 Encounter for pregnancy test, result negative: Secondary | ICD-10-CM | POA: Insufficient documentation

## 2014-07-14 DIAGNOSIS — J45909 Unspecified asthma, uncomplicated: Secondary | ICD-10-CM | POA: Insufficient documentation

## 2014-07-14 LAB — HCG, SERUM, QUALITATIVE: PREG SERUM: NEGATIVE

## 2014-07-14 NOTE — ED Notes (Signed)
Pt requesting a blood pregnancy test. Pt states that for 4 months she has had irregular periods, and she has taken home pregnancy tests that are negative. Pt states 2-3 weeks ago she took a home pregnancy test and it was negative. Pt states cramping during menstrual cycle.

## 2014-07-15 ENCOUNTER — Emergency Department (HOSPITAL_COMMUNITY)
Admission: EM | Admit: 2014-07-15 | Discharge: 2014-07-15 | Disposition: A | Discharge status: Home / Self Care | Payer: Self-pay | Attending: Emergency Medicine | Admitting: Emergency Medicine

## 2014-07-15 DIAGNOSIS — Z3200 Encounter for pregnancy test, result unknown: Secondary | ICD-10-CM

## 2014-07-15 NOTE — ED Provider Notes (Signed)
CSN: [id number]     Arrival date & time 07/14/14  2121 History   None    Chief Complaint  Patient presents with  . Possible Pregnancy     (Consider location/radiation/quality/duration/timing/severity/associated sxs/prior Treatment) The history is provided by the patient. No language interpreter was used.   Tienna Whitehead is a 43 y.o. female with a PMHx of miscarriage and D&C, presenting today wanting to know if she is pregnant. She states that her last menstrual period was 2 weeks ago, occurring at the right time interval, and with normal flow/heaviness, no passage of clots or tissue. She states that she has sexual intercourse with one partner, and it is protected. She states she took a home pregnancy test which was negative but wanted to be sure. She has not had any abdominal pain, but she does report some cramping during her period. No vaginal discharge or bleeding, no passage of clots, no urinary complaints. Denies fevers or chills. States she had some breast tenderness which is what prompted her concern for pregnancy. She wanted to make sure of her pregnancy status because she has a hx of a miscarriage when she didn't know she was pregnant, and had to have a D&C. Denies that she has the same symptoms today.  Past Medical History  Diagnosis Date  . Asthma    Past Surgical History  Procedure Laterality Date  . Dnc     History reviewed. No pertinent family history. History  Substance Use Topics  . Smoking status: Current Every Day Smoker -- 0.25 packs/day    Types: Cigarettes  . Smokeless tobacco: Never Used  . Alcohol Use: No   OB History   Grav Para Term Preterm Abortions TAB SAB Ect Mult Living                 Review of Systems  Constitutional: Negative for fever and unexpected weight change.  Respiratory: Negative for shortness of breath.   Cardiovascular: Negative for chest pain.  Gastrointestinal: Negative for nausea, vomiting, abdominal pain, diarrhea, constipation  and abdominal distention.  Genitourinary: Negative for dysuria, urgency, frequency, hematuria, flank pain, decreased urine volume, vaginal bleeding, vaginal discharge, difficulty urinating, vaginal pain, menstrual problem, pelvic pain and dyspareunia.  Musculoskeletal: Negative for back pain.  Neurological: Negative for weakness, light-headedness and headaches.      Allergies  Aspirin  Home Medications   Prior to Admission medications   Medication Sig Start Date End Date Taking? Authorizing Provider  albuterol (PROVENTIL HFA;VENTOLIN HFA) 108 (90 BASE) MCG/ACT inhaler Inhale 2 puffs into the lungs every 4 (four) hours as needed for shortness of breath.    Historical Provider, MD  HYDROcodone-acetaminophen (NORCO) 5-325 MG per tablet Take 1-2 tablets by mouth every 4 (four) hours as needed. 04/11/14   Arthor Captain, PA-C  ibuprofen (ADVIL,MOTRIN) 200 MG tablet Take 200 mg by mouth daily as needed for mild pain (from cramps).     Historical Provider, MD   BP 128/76  Pulse 82  Temp(Src) 98.5 F (36.9 C) (Oral)  Resp 18  Ht 5\' 5"  (1.651 m)  Wt 238 lb 5 oz (108.098 kg)  BMI 39.66 kg/m2  SpO2 99% Physical Exam  Nursing note and vitals reviewed. Constitutional: She is oriented to person, place, and time. Vital signs are normal. She appears well-developed and well-nourished. No distress.  HENT:  Head: Normocephalic and atraumatic.  Mouth/Throat: Mucous membranes are normal.  Eyes: Conjunctivae and EOM are normal. Right eye exhibits no discharge. Left eye exhibits no discharge.  Neck: Normal range of motion. Neck supple.  Cardiovascular: Normal rate and intact distal pulses.   Pulmonary/Chest: Effort normal.  No chest wall or breast TTP  Abdominal: Soft. Normal appearance and bowel sounds are normal. She exhibits no distension. There is no tenderness. There is no rigidity, no rebound and no guarding.  Soft, NT/ND, no r/g/r, no palpable uterus  Musculoskeletal: Normal range of motion.   Neurological: She is alert and oriented to person, place, and time.  Skin: Skin is warm, dry and intact. No rash noted.  Psychiatric: She has a normal mood and affect.    ED Course  Procedures (including critical care time) Labs Review Labs Reviewed  HCG, SERUM, QUALITATIVE    Imaging Review No results found.   EKG Interpretation None      MDM   Final diagnoses:  Encounter for pregnancy test    Emily Whitehead is a 43 y.o. female presenting today wanting to know whether she was pregnant because she had some breast tenderness in the last few weeks. Has protected sex with one partner. Denies vaginal or urinary complaints, no abd pain. Benign exam. Pregnancy test negative. Discussed with pt that any further GYN concerns should be directed to her OBGYN, [facility] Given lack of vaginal or abd complaints, no other labs or pelvic exam performed at this time. Pt solely wanted a pregnancy test. Pt understands f/up plan and stable at time of d/c.  BP 128/76  Pulse 82  Temp(Src) 98.5 F (36.9 C) (Oral)  Resp 18  Ht 5\' 5"  (1.651 m)  Wt 238 lb 5 oz (108.098 kg)  BMI 39.66 kg/m2  SpO2 99%   Celanese CorporationMercedes Strupp Camprubi-Soms, PA-C 07/15/14 [phone]

## 2014-07-15 NOTE — Discharge Instructions (Signed)
Your pregnancy test was negative. Follow up with the women's outpatient clinic for ongoing gynecologic concerns.   Pregnancy Tests HOW DO PREGNANCY TESTS WORK? All pregnancy tests look for a special hormone in the urine or blood that is only present in pregnant women. This hormone, human chorionic gonadotropin (hCG), is also called the pregnancy hormone.  WHAT IS THE DIFFERENCE BETWEEN A URINE AND A BLOOD PREGNANCY TEST? IS ONE BETTER THAN THE OTHER? There are two types of pregnancy tests.  Blood tests.  Urine tests. Both tests look for the presence of hCG, the pregnancy hormone. Many women use a urine test or home pregnancy test (HPT) to find out if they are pregnant. HPTs are cheap, easy to use, can be done at home, and are private. When a woman has a positive result on an HPT, she needs to see her caregiver right away. The caregiver can confirm a positive HPT result with another urine test, a blood test, ultrasound, and a pelvic exam.  There are two types of blood tests you can get from a caregiver.   A quantitative blood test (or the beta hCG test). This test measures the exact amount of hCG in the blood. This means it can pick up very small amounts of hCG, making it a very accurate test.  A qualitative hCG blood test. This test gives a simple yes or no answer to whether you are pregnant. This test is more like a urine test in terms of its accuracy. Blood tests can pick up hCG earlier in a pregnancy than urine tests can. Blood tests can tell if you are pregnant about 6 to 8 days after you release an egg from an ovary (ovulate). Urine tests can determine pregnancy about 2 weeks after ovulation.  HOW IS A HOME PREGNANCY TEST DONE?  There are many types of home pregnancy tests or HPTs that can be bought over-the-counter at drug or discount stores.   Some involve collecting your urine in a cup and dipping a stick into the urine or putting some of the urine into a special container with an  eyedropper.  Others are done by placing a stick into your urine stream.  Tests vary in how long you need to wait for the stick or container to turn a certain color or have a symbol on it (like a plus or a minus).  All tests come with written instructions. Most tests also have toll-free phone numbers to call if you have any questions about how to do the test or read the results. HOW ACCURATE ARE HOME PREGNANCY TESTS?  HPTs are very accurate. Most brands of HPTs say they are 97% to 99% accurate when taken 1 week after missing your menstrual period, but this can vary with actual use. Each brand varies in how sensitive it is in picking up the pregnancy hormone hCG. If a test is not done correctly, it will be less accurate. Always check the package to make sure it is not past its expiration date. If it is, it will not be accurate. Most brands of HPTs tell users to do the test again in a few days, no matter what the results.  If you use an HPT too early in your pregnancy, you may not have enough of the pregnancy hormone hCG in your urine to have a positive test result. Most HPTs will be accurate if you test yourself around the time your period is due (about 2 weeks after you ovulate). You can get a  negative test result if you are not pregnant or if you ovulated later than you thought you did. You may also have problems with the pregnancy, which affects the amount of hCG you have in your urine. If your HPT is negative, test yourself again within a few days to 1 week. If you keep getting a negative result and think you are pregnant, talk with your caregiver right away about getting a blood pregnancy test.  FALSE POSITIVE PREGNANCY TEST A false positive HPT can happen if there is blood or protein present in your urine. A false positive can also happen if you were recently pregnant or if you take a pregnancy test too soon after taking fertility drug that contains hCG. Also, some prescription medicines such as water  pills (diuretics), tranquilizers, seizure medicines, psychiatric medicines, and allergy and nausea medicines (promethazine) give false positive readings. FALSE NEGATIVE PREGNANCY TEST  A false negative HPT can happen if you do the test too early. Try to wait until you are at least 1 day late for your menstrual period.  It may happen if you wait too long to test the urine (longer than 15 minutes).  It may also happen if the urine is too diluted because you drank a lot of fluids before getting the urine sample. It is best to test the first morning urine after you get out of bed. If your menstrual period did not start after a week of a negative HPT, repeat the pregnancy test. CAN ANYTHING INTERFERE WITH HOME PREGNANCY TEST RESULTS?  Most medicines, both over-the-counter and prescription drugs, including birth control pills and antibiotics, should not affect the results of a HPT. Only those drugs that have the pregnancy hormone hCG in them can give a false positive test result. Drugs that have hCG in them may be used for treating infertility (not being able to get pregnant). Alcohol and illegal drugs do not affect HPT results, but you should not be using these substances if you are trying to get pregnant. If you have a positive pregnancy test, call your caregiver to make an appointment to begin prenatal care. Document Released: 12/15/2003 Document Revised: 03/05/2012 Document Reviewed: 02/25/2011 [facility] Patient Information 2015 [place], [place]. This information is not intended to replace advice given to you by your health care provider. Make sure you discuss any questions you have with your health care provider.

## 2014-07-16 NOTE — ED Provider Notes (Signed)
Medical screening examination/treatment/procedure(s) were performed by non-physician practitioner and as supervising physician I was immediately available for consultation/collaboration.    Vida RollerBrian D Elodie Panameno, MD 07/16/14 [phone]

## 2014-08-10 ENCOUNTER — Emergency Department (HOSPITAL_COMMUNITY)
Admission: EM | Admit: 2014-08-10 | Discharge: 2014-08-11 | Disposition: A | Discharge status: Home / Self Care | Payer: Self-pay | Attending: Emergency Medicine | Admitting: Emergency Medicine

## 2014-08-10 ENCOUNTER — Encounter (HOSPITAL_COMMUNITY): Payer: Self-pay | Admitting: Emergency Medicine

## 2014-08-10 DIAGNOSIS — Z791 Long term (current) use of non-steroidal anti-inflammatories (NSAID): Secondary | ICD-10-CM | POA: Insufficient documentation

## 2014-08-10 DIAGNOSIS — F17200 Nicotine dependence, unspecified, uncomplicated: Secondary | ICD-10-CM | POA: Insufficient documentation

## 2014-08-10 DIAGNOSIS — S0993XA Unspecified injury of face, initial encounter: Secondary | ICD-10-CM | POA: Insufficient documentation

## 2014-08-10 DIAGNOSIS — J45909 Unspecified asthma, uncomplicated: Secondary | ICD-10-CM | POA: Insufficient documentation

## 2014-08-10 DIAGNOSIS — S022XXB Fracture of nasal bones, initial encounter for open fracture: Secondary | ICD-10-CM | POA: Insufficient documentation

## 2014-08-10 DIAGNOSIS — S199XXA Unspecified injury of neck, initial encounter: Secondary | ICD-10-CM

## 2014-08-10 DIAGNOSIS — S025XXA Fracture of tooth (traumatic), initial encounter for closed fracture: Secondary | ICD-10-CM | POA: Insufficient documentation

## 2014-08-10 DIAGNOSIS — Z23 Encounter for immunization: Secondary | ICD-10-CM | POA: Insufficient documentation

## 2014-08-10 DIAGNOSIS — Z79899 Other long term (current) drug therapy: Secondary | ICD-10-CM | POA: Insufficient documentation

## 2014-08-10 DIAGNOSIS — S60229A Contusion of unspecified hand, initial encounter: Secondary | ICD-10-CM | POA: Insufficient documentation

## 2014-08-10 DIAGNOSIS — S60222A Contusion of left hand, initial encounter: Secondary | ICD-10-CM
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
## 2014-08-11 ENCOUNTER — Emergency Department (HOSPITAL_COMMUNITY): Payer: Self-pay

## 2014-08-11 MED ORDER — HYDROMORPHONE HCL PF 1 MG/ML IJ SOLN
1.0000 mg | Freq: Once | INTRAMUSCULAR | Status: AC
  Administered 2014-08-11: 1 mg via INTRAVENOUS
  Filled 2014-08-11: qty 1

## 2014-08-11 MED ORDER — CEPHALEXIN 250 MG PO CAPS
500.0000 mg | ORAL_CAPSULE | Freq: Once | ORAL | Status: AC
  Administered 2014-08-11: 500 mg via ORAL
  Filled 2014-08-11: qty 2

## 2014-08-11 MED ORDER — HYDROCODONE-ACETAMINOPHEN 5-325 MG PO TABS: 1.0000 | ORAL_TABLET | Freq: Four times a day (QID) | ORAL | Status: DC | PRN

## 2014-08-11 MED ORDER — IBUPROFEN 400 MG PO TABS: 400.0000 mg | ORAL_TABLET | Freq: Four times a day (QID) | ORAL | Status: DC | PRN

## 2014-08-11 MED ORDER — CEPHALEXIN 500 MG PO CAPS: 500.0000 mg | ORAL_CAPSULE | Freq: Four times a day (QID) | ORAL | Status: DC

## 2014-08-11 MED ORDER — TETANUS-DIPHTH-ACELL PERTUSSIS 5-2.5-18.5 LF-MCG/0.5 IM SUSP
0.5000 mL | Freq: Once | INTRAMUSCULAR | Status: AC
  Administered 2014-08-11: 0.5 mL via INTRAMUSCULAR
  Filled 2014-08-11: qty 0.5

## 2014-08-11 NOTE — ED Notes (Signed)
C-collar placed on pt.

## 2014-08-11 NOTE — Discharge Instructions (Signed)
Please see the hand doctor for your hand pain if it continues and not getting better. Please see ENT doctor for the nasal fracture. Please see the Dentist in 2 days.  Take the antibiotic as prescribed.   Hand Contusion A hand contusion is a deep bruise on your hand area. Contusions are the result of an injury that caused bleeding under the skin. The contusion may turn blue, purple, or yellow. Minor injuries will give you a painless contusion, but more severe contusions may stay painful and swollen for a few weeks. CAUSES  A contusion is usually caused by a blow, trauma, or direct force to an area of the body. SYMPTOMS   Swelling and redness of the injured area.  Discoloration of the injured area.  Tenderness and soreness of the injured area.  Pain. DIAGNOSIS  The diagnosis can be made by taking a history and performing a physical exam. An X-ray, CT scan, or MRI may be needed to determine if there were any associated injuries, such as broken bones (fractures). TREATMENT  Often, the best treatment for a hand contusion is resting, elevating, icing, and applying cold compresses to the injured area. Over-the-counter medicines may also be recommended for pain control. HOME CARE INSTRUCTIONS   Put ice on the injured area.  Put ice in a plastic bag.  Place a towel between your skin and the bag.  Leave the ice on for 15-20 minutes, 03-04 times a day.  Only take over-the-counter or prescription medicines as directed by your caregiver. Your caregiver may recommend avoiding anti-inflammatory medicines (aspirin, ibuprofen, and naproxen) for 48 hours because these medicines may increase bruising.  If told, use an elastic wrap as directed. This can help reduce swelling. You may remove the wrap for sleeping, showering, and bathing. If your fingers become numb, cold, or blue, take the wrap off and reapply it more loosely.  Elevate your hand with pillows to reduce swelling.  Avoid overusing  your hand if it is painful. SEEK IMMEDIATE MEDICAL CARE IF:   You have increased redness, swelling, or pain in your hand.  Your swelling or pain is not relieved with medicines.  You have loss of feeling in your hand or are unable to move your fingers.  Your hand turns cold or blue.  You have pain when you move your fingers.  Your hand becomes warm to the touch.  Your contusion does not improve in 2 days. MAKE SURE YOU:   Understand these instructions.  Will watch your condition.  Will get help right away if you are not doing well or get worse. Document Released: 06/03/2002 Document Revised: 09/05/2012 Document Reviewed: 06/04/2012 [facility] Patient Information 2015 [place], [place]. This information is not intended to replace advice given to you by your health care provider. Make sure you discuss any questions you have with your health care provider.  Facial Fracture A facial fracture is a break in one of the bones of your face. HOME CARE INSTRUCTIONS   Protect the injured part of your face until it is healed.  Do not participate in activities which give chance for re-injury until your doctor approves.  Gently wash and dry your face.  Wear head and facial protection while riding a bicycle, motorcycle, or snowmobile. SEEK MEDICAL CARE IF:   An oral temperature above 102 F (38.9 C) develops.  You have severe headaches or notice changes in your vision.  You have new numbness or tingling in your face.  You develop nausea (feeling sick to  your stomach), vomiting or a stiff neck. SEEK IMMEDIATE MEDICAL CARE IF:   You develop difficulty seeing or experience double vision.  You become dizzy, lightheaded, or faint.  You develop trouble speaking, breathing, or swallowing.  You have a watery discharge from your nose or ear. MAKE SURE YOU:   Understand these instructions.  Will watch your condition.  Will get help right away if you are not doing well or get  worse. Document Released: 12/12/2005 Document Revised: 03/05/2012 Document Reviewed: 07/31/2008 [facility] Patient Information 2015 [place]. This information is not intended to replace advice given to you by your health care provider. Make sure you discuss any questions you have with your health care provider. Dental Injury Your exam shows that you have injured your teeth. The treatment of broken teeth and other dental injuries depends on how badly they are hurt. All dental injuries should be checked as soon as possible by a dentist if there are:  Loose teeth which may need to be wired or bonded with a plastic device to hold them in place.  Broken teeth with exposed tooth pulp which may cause a serious infection.  Painful teeth especially when you bite or chew.  Sharp tooth edges that cut your tongue or lips. Sometimes, antibiotics or pain medicine are prescribed to prevent infection and control pain. Eat a soft or liquid diet and rinse your mouth out after meals with warm water. You should see a dentist or return here at once if you have increased swelling, increased pain or uncontrolled bleeding from the site of your injury. SEEK MEDICAL CARE IF:   You have increased pain not controlled with medicines.  You have swelling around your tooth, in your face or neck.  You have bleeding which starts, continues, or gets worse.  You have a fever. Document Released: 12/12/2005 Document Revised: 03/05/2012 Document Reviewed: 12/11/2009 [facility] Patient Information 2015 [place], [place]. This information is not intended to replace advice given to you by your health care provider. Make sure you discuss any questions you have with your health care provider.
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
## 2014-09-08 ENCOUNTER — Emergency Department (HOSPITAL_COMMUNITY): Payer: Self-pay

## 2014-09-08 ENCOUNTER — Encounter (HOSPITAL_COMMUNITY): Payer: Self-pay | Admitting: Emergency Medicine

## 2014-09-08 ENCOUNTER — Emergency Department (HOSPITAL_COMMUNITY)
Admission: EM | Admit: 2014-09-08 | Discharge: 2014-09-08 | Disposition: A | Discharge status: Home / Self Care | Payer: Self-pay | Attending: Emergency Medicine | Admitting: Emergency Medicine

## 2014-09-08 DIAGNOSIS — S93409A Sprain of unspecified ligament of unspecified ankle, initial encounter: Secondary | ICD-10-CM | POA: Insufficient documentation

## 2014-09-08 DIAGNOSIS — S93401A Sprain of unspecified ligament of right ankle, initial encounter: Secondary | ICD-10-CM | POA: Diagnosis present

## 2014-09-08 DIAGNOSIS — T07XXXA Unspecified multiple injuries, initial encounter: Secondary | ICD-10-CM | POA: Diagnosis present

## 2014-09-08 DIAGNOSIS — Y9389 Activity, other specified: Secondary | ICD-10-CM | POA: Insufficient documentation

## 2014-09-08 DIAGNOSIS — Y92410 Unspecified street and highway as the place of occurrence of the external cause: Secondary | ICD-10-CM | POA: Insufficient documentation

## 2014-09-08 DIAGNOSIS — S8990XA Unspecified injury of unspecified lower leg, initial encounter: Secondary | ICD-10-CM | POA: Insufficient documentation

## 2014-09-08 DIAGNOSIS — S99919A Unspecified injury of unspecified ankle, initial encounter: Secondary | ICD-10-CM

## 2014-09-08 DIAGNOSIS — IMO0002 Reserved for concepts with insufficient information to code with codable children: Secondary | ICD-10-CM | POA: Insufficient documentation

## 2014-09-08 DIAGNOSIS — Z79899 Other long term (current) drug therapy: Secondary | ICD-10-CM | POA: Insufficient documentation

## 2014-09-08 DIAGNOSIS — J45909 Unspecified asthma, uncomplicated: Secondary | ICD-10-CM | POA: Insufficient documentation

## 2014-09-08 DIAGNOSIS — S32009A Unspecified fracture of unspecified lumbar vertebra, initial encounter for closed fracture: Secondary | ICD-10-CM | POA: Diagnosis present

## 2014-09-08 DIAGNOSIS — F17200 Nicotine dependence, unspecified, uncomplicated: Secondary | ICD-10-CM | POA: Insufficient documentation

## 2014-09-08 DIAGNOSIS — S99929A Unspecified injury of unspecified foot, initial encounter: Secondary | ICD-10-CM

## 2014-09-08 LAB — I-STAT CHEM 8, ED
BUN: 10 mg/dL (ref 6–23)
CREATININE: 0.9 mg/dL (ref 0.50–1.10)
Calcium, Ion: 1.12 mmol/L (ref 1.12–1.23)
Chloride: 109 mEq/L (ref 96–112)
Glucose, Bld: 91 mg/dL (ref 70–99)
HCT: 46 % (ref 36.0–46.0)
Hemoglobin: 15.6 g/dL — ABNORMAL HIGH (ref 12.0–15.0)
Potassium: 4 mEq/L (ref 3.7–5.3)
SODIUM: 139 meq/L (ref 137–147)
TCO2: 23 mmol/L (ref 0–100)

## 2014-09-08 LAB — SAMPLE TO BLOOD BANK

## 2014-09-08 LAB — COMPREHENSIVE METABOLIC PANEL
ALBUMIN: 3.6 g/dL (ref 3.5–5.2)
ALK PHOS: 65 U/L (ref 39–117)
ALT: 15 U/L (ref 0–35)
ANION GAP: 13 (ref 5–15)
AST: 25 U/L (ref 0–37)
BUN: 11 mg/dL (ref 6–23)
CHLORIDE: 105 meq/L (ref 96–112)
CO2: 22 mEq/L (ref 19–32)
Calcium: 9.2 mg/dL (ref 8.4–10.5)
Creatinine, Ser: 0.82 mg/dL (ref 0.50–1.10)
GFR calc Af Amer: >90 mL/min (ref >90)
GFR calc non Af Amer: 88 mL/min — ABNORMAL LOW (ref >90)
Glucose, Bld: 92 mg/dL (ref 70–99)
POTASSIUM: 4.2 meq/L (ref 3.7–5.3)
Sodium: 140 mEq/L (ref 137–147)
Total Bilirubin: 0.7 mg/dL (ref 0.3–1.2)
Total Protein: 6.7 g/dL (ref 6.0–8.3)

## 2014-09-08 LAB — CBC
HEMATOCRIT: 43.6 % (ref 36.0–46.0)
Hemoglobin: 14.9 g/dL (ref 12.0–15.0)
MCH: 32.5 pg (ref 26.0–34.0)
MCHC: 34.2 g/dL (ref 30.0–36.0)
MCV: 95 fL (ref 78.0–100.0)
Platelets: 174 10*3/uL (ref 150–400)
RBC: 4.59 MIL/uL (ref 3.87–5.11)
RDW: 13 % (ref 11.5–15.5)
WBC: 7.5 10*3/uL (ref 4.0–10.5)

## 2014-09-08 LAB — ETHANOL: Alcohol, Ethyl (B): <11 mg/dL (ref 0–11)

## 2014-09-08 LAB — CDS SEROLOGY

## 2014-09-08 MED ORDER — FENTANYL CITRATE 0.05 MG/ML IJ SOLN
INTRAMUSCULAR | Status: AC
  Filled 2014-09-08: qty 2

## 2014-09-08 MED ORDER — FENTANYL CITRATE 0.05 MG/ML IJ SOLN
50.0000 ug | Freq: Once | INTRAMUSCULAR | Status: AC
  Administered 2014-09-08: 50 ug via INTRAVENOUS

## 2014-09-08 MED ORDER — HYDROMORPHONE HCL PF 1 MG/ML IJ SOLN
1.0000 mg | Freq: Once | INTRAMUSCULAR | Status: AC
  Administered 2014-09-08: 1 mg via INTRAVENOUS
  Filled 2014-09-08: qty 1

## 2014-09-08 MED ORDER — MORPHINE SULFATE 4 MG/ML IJ SOLN
4.0000 mg | Freq: Once | INTRAMUSCULAR | Status: AC
  Administered 2014-09-08: 4 mg via INTRAVENOUS
  Filled 2014-09-08: qty 1

## 2014-09-08 MED ORDER — CYCLOBENZAPRINE HCL 10 MG PO TABS: 10.0000 mg | ORAL_TABLET | Freq: Two times a day (BID) | ORAL | Status: DC | PRN

## 2014-09-08 MED ORDER — OXYCODONE-ACETAMINOPHEN 5-325 MG PO TABS
1.0000 | ORAL_TABLET | ORAL | Status: DC | PRN
Start: 2014-09-08 — End: 2014-10-20

## 2014-09-08 MED ORDER — ONDANSETRON HCL 4 MG/2ML IJ SOLN
4.0000 mg | Freq: Once | INTRAMUSCULAR | Status: AC
  Administered 2014-09-08: 4 mg via INTRAVENOUS
  Filled 2014-09-08: qty 2

## 2014-09-08 MED ORDER — IOHEXOL 300 MG/ML  SOLN
100.0000 mL | Freq: Once | INTRAMUSCULAR | Status: AC | PRN
Start: 2014-09-08 — End: 2014-09-08
  Administered 2014-09-08: 100 mL via INTRAVENOUS

## 2014-09-08 MED ORDER — BACITRACIN 500 UNIT/GM EX OINT
1.0000 "application " | TOPICAL_OINTMENT | Freq: Two times a day (BID) | CUTANEOUS | Status: DC
  Filled 2014-09-08 (×2): qty 0.9
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
## 2014-09-08 NOTE — Discharge Instructions (Signed)

## 2014-09-08 NOTE — ED Notes (Signed)
Pt transported to ct scan
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
## 2014-09-08 NOTE — Progress Notes (Signed)
Orthopedic Tech Progress Note Patient Details:  Emily Whitehead 01/23/1971 [id number]  Ortho Devices Type of Ortho Device: Crutches Ortho Device/Splint Interventions: Application   Cammer, Myriam Brandhorst Carol 09/08/2014, 2:49 PM  

## 2014-09-08 NOTE — Progress Notes (Signed)
Orthopedic Tech Progress Note Patient Details:  Emily Whitehead [birth year] [id number]  Ortho Devices Type of Ortho Device: Crutches Ortho Device/Splint Interventions: Application   Shawnie Pons 09/08/2014, 2:49 PM

## 2014-09-08 NOTE — ED Notes (Signed)
Pt returned and placed back on the monitor. Requesting dilaudid for pain.
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
## 2014-09-08 NOTE — ED Notes (Signed)
Pt denies any chance of being pregnant, MD made aware, no further orders received.

## 2014-09-08 NOTE — ED Notes (Signed)
Portable x-rays being completed at this time. 

## 2014-09-08 NOTE — ED Notes (Signed)
Pt ambulatory several steps with the crutches then given a wheelchair to go to the bathroom.
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
## 2014-10-03 ENCOUNTER — Ambulatory Visit: Payer: Self-pay | Admitting: Internal Medicine

## 2014-10-07 ENCOUNTER — Ambulatory Visit: Payer: Self-pay | Admitting: Internal Medicine

## 2014-10-20 ENCOUNTER — Emergency Department (HOSPITAL_COMMUNITY): Payer: No Typology Code available for payment source

## 2014-10-20 ENCOUNTER — Encounter (HOSPITAL_COMMUNITY): Payer: Self-pay | Admitting: Emergency Medicine

## 2014-10-20 ENCOUNTER — Emergency Department (HOSPITAL_COMMUNITY)
Admission: EM | Admit: 2014-10-20 | Discharge: 2014-10-20 | Disposition: A | Discharge status: Home / Self Care | Payer: No Typology Code available for payment source | Attending: Emergency Medicine | Admitting: Emergency Medicine

## 2014-10-20 DIAGNOSIS — Z720 Tobacco use: Secondary | ICD-10-CM | POA: Insufficient documentation

## 2014-10-20 DIAGNOSIS — M545 Low back pain: Secondary | ICD-10-CM | POA: Insufficient documentation

## 2014-10-20 DIAGNOSIS — M549 Dorsalgia, unspecified: Secondary | ICD-10-CM

## 2014-10-20 DIAGNOSIS — Z79899 Other long term (current) drug therapy: Secondary | ICD-10-CM | POA: Insufficient documentation

## 2014-10-20 DIAGNOSIS — G8911 Acute pain due to trauma: Secondary | ICD-10-CM | POA: Insufficient documentation

## 2014-10-20 DIAGNOSIS — R05 Cough: Secondary | ICD-10-CM

## 2014-10-20 DIAGNOSIS — J45901 Unspecified asthma with (acute) exacerbation: Secondary | ICD-10-CM

## 2014-10-20 DIAGNOSIS — Z8781 Personal history of (healed) traumatic fracture: Secondary | ICD-10-CM | POA: Insufficient documentation

## 2014-10-20 DIAGNOSIS — R059 Cough, unspecified: Secondary | ICD-10-CM

## 2014-10-20 LAB — CBC
HEMATOCRIT: 44.5 % (ref 36.0–46.0)
Hemoglobin: 14.8 g/dL (ref 12.0–15.0)
MCH: 32.4 pg (ref 26.0–34.0)
MCHC: 33.3 g/dL (ref 30.0–36.0)
MCV: 97.4 fL (ref 78.0–100.0)
Platelets: 206 10*3/uL (ref 150–400)
RBC: 4.57 MIL/uL (ref 3.87–5.11)
RDW: 12.5 % (ref 11.5–15.5)
WBC: 7.4 10*3/uL (ref 4.0–10.5)

## 2014-10-20 LAB — COMPREHENSIVE METABOLIC PANEL
ALT: 27 U/L (ref 0–35)
AST: 22 U/L (ref 0–37)
Albumin: 4.1 g/dL (ref 3.5–5.2)
Alkaline Phosphatase: 114 U/L (ref 39–117)
Anion gap: 13 (ref 5–15)
BUN: 14 mg/dL (ref 6–23)
CALCIUM: 9.1 mg/dL (ref 8.4–10.5)
CO2: 25 mEq/L (ref 19–32)
Chloride: 102 mEq/L (ref 96–112)
Creatinine, Ser: 0.89 mg/dL (ref 0.50–1.10)
GFR, EST NON AFRICAN AMERICAN: 79 mL/min — AB (ref >90)
GLUCOSE: 96 mg/dL (ref 70–99)
Potassium: 4 mEq/L (ref 3.7–5.3)
Sodium: 140 mEq/L (ref 137–147)
Total Bilirubin: 0.2 mg/dL — ABNORMAL LOW (ref 0.3–1.2)
Total Protein: 8 g/dL (ref 6.0–8.3)

## 2014-10-20 LAB — TROPONIN I: Troponin I: <0.3 ng/mL (ref <0.30)

## 2014-10-20 MED ORDER — OXYCODONE-ACETAMINOPHEN 5-325 MG PO TABS: 1.0000 | ORAL_TABLET | ORAL | Status: DC | PRN

## 2014-10-20 MED ORDER — PREDNISONE 20 MG PO TABS
60.0000 mg | ORAL_TABLET | Freq: Once | ORAL | Status: AC
  Administered 2014-10-20: 60 mg via ORAL
  Filled 2014-10-20: qty 3

## 2014-10-20 MED ORDER — OXYCODONE-ACETAMINOPHEN 5-325 MG PO TABS
1.0000 | ORAL_TABLET | Freq: Once | ORAL | Status: AC
Start: 2014-10-20 — End: 2014-10-20
  Administered 2014-10-20: 1 via ORAL
  Filled 2014-10-20: qty 1

## 2014-10-20 MED ORDER — PREDNISONE 20 MG PO TABS: 40.0000 mg | ORAL_TABLET | Freq: Every day | ORAL | Status: DC

## 2014-10-20 MED ORDER — IPRATROPIUM-ALBUTEROL 0.5-2.5 (3) MG/3ML IN SOLN
3.0000 mL | Freq: Once | RESPIRATORY_TRACT | Status: AC
  Administered 2014-10-20: 3 mL via RESPIRATORY_TRACT
  Filled 2014-10-20: qty 3

## 2014-10-20 MED ORDER — ALBUTEROL SULFATE (2.5 MG/3ML) 0.083% IN NEBU
5.0000 mg | INHALATION_SOLUTION | Freq: Once | RESPIRATORY_TRACT | Status: AC
  Administered 2014-10-20: 5 mg via RESPIRATORY_TRACT
  Filled 2014-10-20: qty 6

## 2014-10-20 MED ORDER — ALBUTEROL SULFATE HFA 108 (90 BASE) MCG/ACT IN AERS
1.0000 | INHALATION_SPRAY | Freq: Four times a day (QID) | RESPIRATORY_TRACT | Status: DC | PRN
Start: 2014-10-20 — End: 2014-11-11

## 2014-10-20 NOTE — ED Notes (Signed)
Pt still feeling SOB after breathing treatment given.
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
## 2014-10-20 NOTE — ED Notes (Signed)
Pt with hx of asthma c/o cough.  States she has been around friends that are sick.  Used inhaler 4 times today with no relief with sob.  Per EMS vs stable.  Pt ambulated from ambulance bay to triage with no difficulty.

## 2014-10-20 NOTE — Discharge Instructions (Signed)
Take the prescribed medication as directed. °Follow-up with your primary care physician. °Return to the ED for new or worsening symptoms. ° °

## 2014-10-21 NOTE — ED Provider Notes (Signed)
Medical screening examination/treatment/procedure(s) were performed by non-physician practitioner and as supervising physician I was immediately available for consultation/collaboration.   EKG Interpretation None       Arby BarretteMarcy Khloe Hunkele, MD 10/21/14 0003

## 2014-11-06 ENCOUNTER — Emergency Department (HOSPITAL_COMMUNITY)
Admission: EM | Admit: 2014-11-06 | Discharge: 2014-11-06 | Disposition: A | Discharge status: Home / Self Care | Payer: Self-pay | Attending: Emergency Medicine | Admitting: Emergency Medicine

## 2014-11-06 ENCOUNTER — Emergency Department (HOSPITAL_COMMUNITY): Payer: Self-pay

## 2014-11-06 ENCOUNTER — Encounter (HOSPITAL_COMMUNITY): Payer: Self-pay | Admitting: Emergency Medicine

## 2014-11-06 DIAGNOSIS — Y998 Other external cause status: Secondary | ICD-10-CM | POA: Insufficient documentation

## 2014-11-06 DIAGNOSIS — Z7952 Long term (current) use of systemic steroids: Secondary | ICD-10-CM | POA: Insufficient documentation

## 2014-11-06 DIAGNOSIS — W101XXA Fall (on)(from) sidewalk curb, initial encounter: Secondary | ICD-10-CM | POA: Insufficient documentation

## 2014-11-06 DIAGNOSIS — S8011XA Contusion of right lower leg, initial encounter: Secondary | ICD-10-CM | POA: Insufficient documentation

## 2014-11-06 DIAGNOSIS — Z720 Tobacco use: Secondary | ICD-10-CM | POA: Insufficient documentation

## 2014-11-06 DIAGNOSIS — J45909 Unspecified asthma, uncomplicated: Secondary | ICD-10-CM | POA: Insufficient documentation

## 2014-11-06 DIAGNOSIS — Z79899 Other long term (current) drug therapy: Secondary | ICD-10-CM | POA: Insufficient documentation

## 2014-11-06 DIAGNOSIS — Y9289 Other specified places as the place of occurrence of the external cause: Secondary | ICD-10-CM | POA: Insufficient documentation

## 2014-11-06 DIAGNOSIS — Y9389 Activity, other specified: Secondary | ICD-10-CM | POA: Insufficient documentation

## 2014-11-06 MED ORDER — HYDROCODONE-ACETAMINOPHEN 5-325 MG PO TABS
1.0000 | ORAL_TABLET | Freq: Once | ORAL | Status: AC
  Administered 2014-11-06: 1 via ORAL
  Filled 2014-11-06: qty 1

## 2014-11-06 MED ORDER — NAPROXEN 500 MG PO TABS: 500.0000 mg | ORAL_TABLET | Freq: Two times a day (BID) | ORAL | Status: DC

## 2014-11-06 MED ORDER — HYDROCODONE-ACETAMINOPHEN 5-325 MG PO TABS
1.0000 | ORAL_TABLET | Freq: Four times a day (QID) | ORAL | Status: DC | PRN
Start: 2014-11-06 — End: 2015-07-20

## 2014-11-06 NOTE — ED Notes (Signed)
Pt received to TR 6. Due to incorrect name the registration process has to be restarted. Pt remains in wheelchair awaiting xray. Friend at bedside. Registration at bedside to merge records.

## 2014-11-06 NOTE — Progress Notes (Signed)
Orthopedic Tech Progress Note Patient Details:  Emily Whitehead 08/21/1971 [id number] Applied ACE wrap to RLE mid-calf.  Fit crutches and taught pt. use of same.  Pulses, sensation, motion intact before and after application.  Capillary refill less than 2 seconds before and after application. Ortho Devices Type of Ortho Device: Ace wrap, Crutches Ortho Device/Splint Location: RLE Ortho Device/Splint Interventions: Application   Lesle ChrisGilliland, Brenden Rudman L 11/06/2014, 5:40 PM
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
## 2014-11-06 NOTE — Discharge Instructions (Signed)
Contusion °A contusion is the result of an injury to the skin and underlying tissues and is usually caused by direct trauma. The injury results in the appearance of a bruise on the skin overlying the injured tissues. Contusions cause rupture and bleeding of the small capillaries and blood vessels and affect function, because the bleeding infiltrates muscles, tendons, nerves, or other soft tissues.  °SYMPTOMS  °· Swelling and often a hard lump in the injured area, either superficial or deep. °· Pain and tenderness over the area of the contusion. °· Feeling of firmness when pressure is exerted over the contusion. °· Discoloration under the skin, beginning with redness and progressing to the characteristic "black and blue" bruise. °CAUSES  °A contusion is typically the result of direct trauma. This is often by a blunt object.  °RISK INCREASES WITH: °· Sports that have a high likelihood of trauma (football, boxing, ice hockey, soccer, field hockey, martial arts, basketball, and baseball). °· Sports that make falling from a height likely (high-jumping, pole-vaulting, skating, or gymnastics). °· Any bleeding disorder (hemophilia) or taking medications that affect clotting (aspirin, nonsteroidal anti-inflammatory medications, or warfarin [Coumadin]). °· Inadequate protection of exposed areas during contact sports. °PREVENTION °· Maintain physical fitness: °¨ Joint and muscle flexibility. °¨ Strength and endurance. °¨ Coordination. °· Wear proper protective equipment. Make sure it fits correctly. °PROGNOSIS  °Contusions typically heal without any complications. Healing time varies with the severity of injury and intake of medications that affect clotting. Contusions usually heal in 1 to 4 weeks. °RELATED COMPLICATIONS  °· Damage to nearby nerves or blood vessels, causing numbness, coldness, or paleness. °· Compartment syndrome. °· Bleeding into the soft tissues that leads to disability. °· Infiltrative-type bleeding,  leading to the calcification and impaired function of the injured muscle (rare). °· Prolonged healing time if usual activities are resumed too soon. °· Infection if the skin over the injury site is broken. °· Fracture of the bone underlying the contusion. °· Stiffness in the joint where the injured muscle crosses. °TREATMENT  °Treatment initially consists of resting the injured area as well as medication and ice to reduce inflammation. The use of a compression bandage may also be helpful in minimizing inflammation. As pain diminishes and movement is tolerated, the joint where the affected muscle crosses should be moved to prevent stiffness and the shortening (contracture) of the joint. Movement of the joint should begin as soon as possible. It is also important to work on maintaining strength within the affected muscles. °Occasionally, extra padding over the area of contusion may be recommended before returning to sports, particularly if re-injury is likely.  °MEDICATION  °· If pain relief is necessary these medications are often recommended: °¨ Nonsteroidal anti-inflammatory medications, such as aspirin and ibuprofen. °¨ Other minor pain relievers, such as acetaminophen, are often recommended. °· Prescription pain relievers may be given by your caregiver. Use only as directed and only as much as you need. °HEAT AND COLD °· Cold treatment (icing) relieves pain and reduces inflammation. Cold treatment should be applied for 10 to 15 minutes every 2 to 3 hours for inflammation and pain and immediately after any activity that aggravates your symptoms. Use ice packs or an ice massage. (To do an ice massage fill a large styrofoam cup with water and freeze. Tear a small amount of foam from the top so ice protrudes. Massage ice firmly over the injured area in a circle about the size of a softball.) °· Heat treatment may be used prior to   performing the stretching and strengthening activities prescribed by your caregiver,  physical therapist, or athletic trainer. Use a heat pack or a warm soak. °SEEK MEDICAL CARE IF:  °· Symptoms get worse or do not improve despite treatment in a few days. °· You have difficulty moving a joint. °· Any extremity becomes extremely painful, numb, pale, or cool (This is an emergency!). °· Medication produces any side effects (bleeding, upset stomach, or allergic reaction). °· Signs of infection (drainage from skin, headache, muscle aches, dizziness, fever, or general ill feeling) occur if skin was broken. °Document Released: 12/12/2005 Document Revised: 03/05/2012 Document Reviewed: 03/26/2009 °[employer]® Patient Information ©2015 [employer]. This information is not intended to replace advice given to you by your health care provider. Make sure you discuss any questions you have with your health care provider. ° °

## 2014-11-06 NOTE — ED Provider Notes (Signed)
CSN: [id number]     Arrival date & time 11/06/14  1341 History  This chart was scribed for non-physician practitioner working with Nelia Shiobert L Beaton, MD by Richarda Overlieichard Holland, ED Scribe. This patient was seen in room WTR6/WTR6 and the patient's care was started at 3:40 PM.    No chief complaint on file.  HPI HPI Comments: Emily Whitehead is a 43 y.o. female who presents to the Emergency Department complaining of right lower leg pain that occurred last night. Pt states she tripped on a curb and fell striking her right knee on the curb. No precipitating symptoms prior to the fall other than it was dark. She denies any head injury, skin punctures or LOC. She reports associated swelling and some numbness to the area. Pt applied ice to the area which she says helped reduce the swelling. She reports that her leg gradually became more painful over the night and this morning. She reports it hurts to weight bear and she has been walking with crutches. She denies tingling, fevers, chills, nausea, and vomiting. She reports no previous surgeries in her right leg.    Past Medical History  Diagnosis Date  . Asthma    Past Surgical History  Procedure Laterality Date  . Dnc     No family history on file. History  Substance Use Topics  . Smoking status: Current Every Day Smoker -- 0.25 packs/day    Types: Cigarettes  . Smokeless tobacco: Never Used  . Alcohol Use: No   OB History    No data available     Review of Systems  Constitutional: Negative for fever and chills.  Gastrointestinal: Negative for nausea and vomiting.  Musculoskeletal: Positive for arthralgias.      Allergies  Aspirin  Home Medications   Prior to Admission medications   Medication Sig Start Date End Date Taking? Authorizing Provider  albuterol (PROVENTIL HFA;VENTOLIN HFA) 108 (90 BASE) MCG/ACT inhaler Inhale 2 puffs into the lungs every 4 (four) hours as needed for shortness of breath.    Historical Provider, MD   albuterol (PROVENTIL HFA;VENTOLIN HFA) 108 (90 BASE) MCG/ACT inhaler Inhale 1-2 puffs into the lungs every 6 (six) hours as needed for wheezing. 10/20/14   Garlon HatchetLisa M Sanders, PA-C  ibuprofen (ADVIL,MOTRIN) 800 MG tablet Take 800 mg by mouth every 8 (eight) hours as needed (pain).     Historical Provider, MD  oxyCODONE-acetaminophen (PERCOCET/ROXICET) 5-325 MG per tablet Take 1 tablet by mouth every 4 (four) hours as needed. 10/20/14   Garlon HatchetLisa M Sanders, PA-C  predniSONE (DELTASONE) 20 MG tablet Take 2 tablets (40 mg total) by mouth daily. Take 40 mg by mouth daily for 3 days, then 20mg  by mouth daily for 3 days, then 10mg  daily for 3 days 10/20/14   Garlon HatchetLisa M Sanders, PA-C   BP 113/72 mmHg  Pulse 98  Temp(Src) 98.4 F (36.9 C) (Oral)  SpO2 98%  LMP 09/24/2014 (Approximate) Physical Exam  Constitutional: She is oriented to person, place, and time. She appears well-developed and well-nourished.  Pt has compression, dressing and crutches.                                                             HENT:  Head: Normocephalic and atraumatic.  Neck: Normal range of motion. Neck supple. No  tracheal deviation present.  Cardiovascular: Normal rate.   Pulmonary/Chest: Effort normal.  Abdominal: She exhibits no distension.  Musculoskeletal: She exhibits tenderness.  Right lower extremity: Contusion noted to mid anterior tib/fib without any skin laceration. TTP but no crepitus and no obvious deformity. Right ankle and right knee full ROM and intact distal pulses.   Neurological: She is alert and oriented to person, place, and time.  Skin: Skin is warm and dry.  Psychiatric: She has a normal mood and affect. Her behavior is normal.  Nursing note and vitals reviewed.   ED Course  Procedures  DIAGNOSTIC STUDIES: Oxygen Saturation is 98% on RA, normal by my interpretation.    COORDINATION OF CARE: 3:44 PM Mechanical fall, had a contusion, no acute fx.  Discussed treatment plan with pt at bedside and pt  agreed to plan. Advised pt to take ibuprofen. Will prescribe a short course of Vicodin to take as needed for pain. Otherwise RICE therapy discussed.     Labs Review Labs Reviewed - No data to display  Imaging Review Dg Tibia/fibula Right  11/06/2014   CLINICAL DATA:  Patient states she hit her right leg against the curb today; pain and bruising on anterior side and about 1/3 of the way up the right tib/fib  EXAM: RIGHT TIBIA AND FIBULA - 2 VIEW  COMPARISON:  09/08/2014.  FINDINGS: Mild subcutaneous edema. No fracture or dislocation seen. Posterior patellar spurs. Mild medial and lateral spur formation at the knee.  IMPRESSION: 1. No fracture. 2. Right knee degenerative changes.   Electronically Signed   By: Gordan PaymentSteve  Reid M.D.   On: 11/06/2014 15:19     EKG Interpretation None      MDM   Final diagnoses:  Contusion, lower leg, right, initial encounter   BP 113/72 mmHg  Pulse 98  Temp(Src) 98.4 F (36.9 C) (Oral)  SpO2 98%  LMP 09/24/2014 (Approximate)  I have reviewed nursing notes and vital signs. I personally reviewed the imaging tests through PACS system  I reviewed available ER/hospitalization records thought the EMR   I personally performed the services described in this documentation, which was scribed in my presence. The recorded information has been reviewed and is accurate.       Fayrene HelperBowie Nyellie Yetter, PA-C 11/06/14 1550  Gerhard Munchobert Lockwood, MD 11/06/14 [id number]

## 2014-11-11 ENCOUNTER — Encounter: Payer: Self-pay | Admitting: Internal Medicine

## 2014-11-11 ENCOUNTER — Ambulatory Visit: Payer: Self-pay | Attending: Internal Medicine | Admitting: Internal Medicine

## 2014-11-11 VITALS — BP 133/87 | HR 88 | Temp 98.8°F | Resp 16 | Wt 250.0 lb

## 2014-11-11 DIAGNOSIS — F17210 Nicotine dependence, cigarettes, uncomplicated: Secondary | ICD-10-CM | POA: Insufficient documentation

## 2014-11-11 DIAGNOSIS — J4521 Mild intermittent asthma with (acute) exacerbation: Secondary | ICD-10-CM

## 2014-11-11 DIAGNOSIS — Z791 Long term (current) use of non-steroidal anti-inflammatories (NSAID): Secondary | ICD-10-CM | POA: Insufficient documentation

## 2014-11-11 DIAGNOSIS — Z139 Encounter for screening, unspecified: Secondary | ICD-10-CM

## 2014-11-11 DIAGNOSIS — Z720 Tobacco use: Secondary | ICD-10-CM

## 2014-11-11 DIAGNOSIS — F17200 Nicotine dependence, unspecified, uncomplicated: Secondary | ICD-10-CM | POA: Insufficient documentation

## 2014-11-11 DIAGNOSIS — N912 Amenorrhea, unspecified: Secondary | ICD-10-CM | POA: Insufficient documentation

## 2014-11-11 DIAGNOSIS — N926 Irregular menstruation, unspecified: Secondary | ICD-10-CM

## 2014-11-11 DIAGNOSIS — Z113 Encounter for screening for infections with a predominantly sexual mode of transmission: Secondary | ICD-10-CM

## 2014-11-11 DIAGNOSIS — IMO0001 Reserved for inherently not codable concepts without codable children: Secondary | ICD-10-CM

## 2014-11-11 DIAGNOSIS — Z3202 Encounter for pregnancy test, result negative: Secondary | ICD-10-CM | POA: Insufficient documentation

## 2014-11-11 DIAGNOSIS — M79604 Pain in right leg: Secondary | ICD-10-CM

## 2014-11-11 DIAGNOSIS — J45901 Unspecified asthma with (acute) exacerbation: Secondary | ICD-10-CM | POA: Insufficient documentation

## 2014-11-11 LAB — POCT URINE PREGNANCY: PREG TEST UR: NEGATIVE

## 2014-11-11 MED ORDER — IBUPROFEN 800 MG PO TABS: 800.0000 mg | ORAL_TABLET | Freq: Three times a day (TID) | ORAL | Status: DC | PRN

## 2014-11-11 MED ORDER — ALBUTEROL SULFATE HFA 108 (90 BASE) MCG/ACT IN AERS: 1.0000 | INHALATION_SPRAY | Freq: Four times a day (QID) | RESPIRATORY_TRACT | Status: DC | PRN

## 2014-11-11 NOTE — Progress Notes (Signed)
Patient ID: Emily Whitehead, female   DOB: 01/09/1971, 43 y.o.   MRN: [id number] Physical Exam  Genitourinary: Vagina normal and uterus normal. There is tenderness on the left labia. Cervix exhibits no motion tenderness, no discharge and no friability. Right adnexum displays no mass and no tenderness. Left adnexum displays no mass and no tenderness. No vaginal discharge found.  Lymphadenopathy:       Right: No inguinal adenopathy present.       Left: No inguinal adenopathy present.   Patient denies all symptoms and STD exposure.

## 2014-11-11 NOTE — Progress Notes (Signed)
Patient here to establish care Patient is requesting pregnancy test because she has some missed periods Patient does have a history of asthma Requesting some refills on her medications
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
## 2014-11-12 LAB — COMPLETE METABOLIC PANEL WITH GFR
ALT: 11 U/L (ref 0–35)
AST: 18 U/L (ref 0–37)
Albumin: 3.9 g/dL (ref 3.5–5.2)
Alkaline Phosphatase: 67 U/L (ref 39–117)
BUN: 18 mg/dL (ref 6–23)
CALCIUM: 9.5 mg/dL (ref 8.4–10.5)
CHLORIDE: 105 meq/L (ref 96–112)
CO2: 28 meq/L (ref 19–32)
Creat: 0.8 mg/dL (ref 0.50–1.10)
GFR, Est Non African American: >89 mL/min
Glucose, Bld: 81 mg/dL (ref 70–99)
Potassium: 5.1 mEq/L (ref 3.5–5.3)
Sodium: 142 mEq/L (ref 135–145)
TOTAL PROTEIN: 6.9 g/dL (ref 6.0–8.3)
Total Bilirubin: 0.4 mg/dL (ref 0.2–1.2)

## 2014-11-12 LAB — CBC WITH DIFFERENTIAL/PLATELET
Basophils Absolute: 0 10*3/uL (ref 0.0–0.1)
Basophils Relative: 0 % (ref 0–1)
EOS PCT: 1 % (ref 0–5)
Eosinophils Absolute: 0.1 10*3/uL (ref 0.0–0.7)
HCT: 38.8 % (ref 36.0–46.0)
HEMOGLOBIN: 13 g/dL (ref 12.0–15.0)
LYMPHS ABS: 2 10*3/uL (ref 0.7–4.0)
Lymphocytes Relative: 23 % (ref 12–46)
MCH: 31.7 pg (ref 26.0–34.0)
MCHC: 33.5 g/dL (ref 30.0–36.0)
MCV: 94.6 fL (ref 78.0–100.0)
MPV: 12 fL (ref 9.4–12.4)
Monocytes Absolute: 0.7 10*3/uL (ref 0.1–1.0)
Monocytes Relative: 8 % (ref 3–12)
NEUTROS ABS: 5.8 10*3/uL (ref 1.7–7.7)
NEUTROS PCT: 68 % (ref 43–77)
Platelets: 202 10*3/uL (ref 150–400)
RBC: 4.1 MIL/uL (ref 3.87–5.11)
RDW: 13.1 % (ref 11.5–15.5)
WBC: 8.6 10*3/uL (ref 4.0–10.5)

## 2014-11-12 LAB — TSH: TSH: 3.288 u[IU]/mL (ref 0.350–4.500)

## 2014-11-12 LAB — HIV ANTIBODY (ROUTINE TESTING W REFLEX): HIV 1&2 Ab, 4th Generation: NONREACTIVE

## 2014-11-12 LAB — RPR

## 2014-11-12 LAB — HEMOGLOBIN A1C
Hgb A1c MFr Bld: 5.5 % (ref <5.7)
Mean Plasma Glucose: 111 mg/dL (ref <117)

## 2014-11-12 LAB — HCG, SERUM, QUALITATIVE: PREG SERUM: NEGATIVE

## 2014-11-12 LAB — VITAMIN D 25 HYDROXY (VIT D DEFICIENCY, FRACTURES): VIT D 25 HYDROXY: 29 ng/mL — AB (ref 30–100)

## 2014-11-13 ENCOUNTER — Telehealth: Payer: Self-pay

## 2014-11-13 LAB — CERVICOVAGINAL ANCILLARY ONLY
CHLAMYDIA, DNA PROBE: NEGATIVE
NEISSERIA GONORRHEA: NEGATIVE
WET PREP (BD AFFIRM): NEGATIVE
Wet Prep (BD Affirm): NEGATIVE
Wet Prep (BD Affirm): POSITIVE — AB

## 2014-11-13 LAB — CYTOLOGY - PAP

## 2014-11-13 NOTE — Telephone Encounter (Signed)
-----   Message from Emily Cheadleeepak Advani, MD sent at 11/12/2014 11:45 AM EST ----- Call and let the patient know that her blood work is normal except for borderline low vitamin D, advise patient to take over-the-counter vitamin D supplement 2000 units daily.

## 2014-11-13 NOTE — Telephone Encounter (Signed)
Spoke with patient and she is aware of her lab results 

## 2014-11-18 ENCOUNTER — Telehealth: Payer: Self-pay | Admitting: Emergency Medicine

## 2014-11-18 MED ORDER — METRONIDAZOLE 500 MG PO TABS: 500.0000 mg | ORAL_TABLET | Freq: Two times a day (BID) | ORAL | Status: DC

## 2014-11-18 NOTE — Telephone Encounter (Signed)
-----   Message from Ambrose FinlandValerie A Keck, NP sent at 11/17/2014 10:46 PM EST ----- Patient pap smear showed ASCUS.  She will need to be scheduled for a colpo soon.  She will possibly need GYN after her colpo

## 2014-11-18 NOTE — Telephone Encounter (Signed)
Left message for pt to call when message received Scheduled Colpo @ [facility] 12/10 @ 1030 am Medication Flagyl 500 mg [facility]

## 2014-12-04 ENCOUNTER — Ambulatory Visit: Payer: Self-pay

## 2015-01-13 ENCOUNTER — Emergency Department (HOSPITAL_COMMUNITY)
Admission: EM | Admit: 2015-01-13 | Discharge: 2015-01-13 | Disposition: A | Discharge status: Home / Self Care | Payer: No Typology Code available for payment source | Attending: Emergency Medicine | Admitting: Emergency Medicine

## 2015-01-13 ENCOUNTER — Encounter (HOSPITAL_COMMUNITY): Payer: Self-pay | Admitting: Emergency Medicine

## 2015-01-13 DIAGNOSIS — Z79899 Other long term (current) drug therapy: Secondary | ICD-10-CM | POA: Insufficient documentation

## 2015-01-13 DIAGNOSIS — R197 Diarrhea, unspecified: Secondary | ICD-10-CM | POA: Insufficient documentation

## 2015-01-13 DIAGNOSIS — M79672 Pain in left foot: Secondary | ICD-10-CM | POA: Insufficient documentation

## 2015-01-13 DIAGNOSIS — Z792 Long term (current) use of antibiotics: Secondary | ICD-10-CM | POA: Insufficient documentation

## 2015-01-13 DIAGNOSIS — Z7952 Long term (current) use of systemic steroids: Secondary | ICD-10-CM | POA: Insufficient documentation

## 2015-01-13 DIAGNOSIS — Z791 Long term (current) use of non-steroidal anti-inflammatories (NSAID): Secondary | ICD-10-CM | POA: Insufficient documentation

## 2015-01-13 DIAGNOSIS — J069 Acute upper respiratory infection, unspecified: Secondary | ICD-10-CM | POA: Insufficient documentation

## 2015-01-13 DIAGNOSIS — J45909 Unspecified asthma, uncomplicated: Secondary | ICD-10-CM | POA: Insufficient documentation

## 2015-01-13 DIAGNOSIS — Z720 Tobacco use: Secondary | ICD-10-CM | POA: Insufficient documentation

## 2015-01-13 DIAGNOSIS — M79671 Pain in right foot: Secondary | ICD-10-CM | POA: Insufficient documentation

## 2015-01-13 MED ORDER — TIOTROPIUM BROMIDE MONOHYDRATE 18 MCG IN CAPS: 18.0000 ug | ORAL_CAPSULE | Freq: Every day | RESPIRATORY_TRACT | Status: DC

## 2015-01-13 MED ORDER — PREDNISONE 20 MG PO TABS: 40.0000 mg | ORAL_TABLET | Freq: Every day | ORAL | Status: DC

## 2015-01-13 MED ORDER — IBUPROFEN 800 MG PO TABS: 800.0000 mg | ORAL_TABLET | Freq: Three times a day (TID) | ORAL | Status: DC

## 2015-01-13 MED ORDER — TRAMADOL HCL 50 MG PO TABS: 50.0000 mg | ORAL_TABLET | Freq: Four times a day (QID) | ORAL | Status: DC | PRN

## 2015-01-13 NOTE — Discharge Instructions (Signed)
Take Tramadol as needed for pain. You will be contacted in 1-2 days if your stool sample is positive. Take Prednisone as directed until gone for your breathing. Refer to attached documents for more information.

## 2015-01-13 NOTE — ED Provider Notes (Signed)
CSN: [id number]     Arrival date & time 01/13/15  0818 History   First MD Initiated Contact with Patient 01/13/15 [phone]     Chief Complaint  Patient presents with  . Abdominal Pain     (Consider location/radiation/quality/duration/timing/severity/associated sxs/prior Treatment) HPI Comments: Patient is a 44 year old female with a past medical history of asthma who presents with bilateral foot pain and abdominal pain for the past 19 years. The pain is located in her generalized abdomen and in both feet and does not radiate. The pain is described as aching and moderate. The pain started gradually and progressively worsened since the onset. 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 She was told she was have to live with the infection for the rest of her life. No alleviating/aggravating factors. The patient has tried ibuprofen for symptoms without relief. Associated symptoms include abdominal bloating and occasional diarrhea. Patient denies fever, headache, nausea, vomiting, chest pain, SOB, dysuria, constipation, abnormal vaginal bleeding/discharge.      Past Medical History  Diagnosis Date  . Asthma    Past Surgical History  Procedure Laterality Date  . Dnc     Family History  Problem Relation Age of Onset  . Cancer Mother     ovarian cancer    History  Substance Use Topics  . Smoking status: Current Every Day Smoker -- 0.25 packs/day    Types: Cigarettes  . Smokeless tobacco: Never Used  . Alcohol Use: No   OB History    No data available     Review of Systems  Constitutional: Negative for fever, chills and fatigue.  HENT: Negative for trouble swallowing.   Eyes: Negative for visual disturbance.  Respiratory: Positive for cough. Negative for shortness of breath.   Cardiovascular: Negative for chest pain and palpitations.  Gastrointestinal: Positive for abdominal pain and diarrhea. Negative for nausea and  vomiting.  Genitourinary: Negative for dysuria and difficulty urinating.  Musculoskeletal: Negative for arthralgias and neck pain.  Skin: Negative for color change.  Neurological: Negative for dizziness and weakness.  Psychiatric/Behavioral: Negative for dysphoric mood.      Allergies  Aspirin  Home Medications   Prior to Admission medications   Medication Sig Start Date End Date Taking? Authorizing Provider  albuterol (PROVENTIL HFA;VENTOLIN HFA) 108 (90 BASE) MCG/ACT inhaler Inhale 2 puffs into the lungs every 4 (four) hours as needed for shortness of breath.    Historical Provider, MD  albuterol (PROVENTIL HFA;VENTOLIN HFA) 108 (90 BASE) MCG/ACT inhaler Inhale 1-2 puffs into the lungs every 6 (six) hours as needed for wheezing. 11/11/14   Doris Cheadle, MD  HYDROcodone-acetaminophen (NORCO/VICODIN) 5-325 MG per tablet Take 1 tablet by mouth every 6 (six) hours as needed for moderate pain. 11/06/14   Fayrene Helper, PA-C  ibuprofen (ADVIL,MOTRIN) 800 MG tablet Take 1 tablet (800 mg total) by mouth every 8 (eight) hours as needed (pain). 11/11/14   Doris Cheadle, MD  metroNIDAZOLE (FLAGYL) 500 MG tablet Take 1 tablet (500 mg total) by mouth 2 (two) times daily. 11/18/14   Ambrose Finland, NP  naproxen (NAPROSYN) 500 MG tablet Take 1 tablet (500 mg total) by mouth 2 (two) times daily. 11/06/14   Fayrene Helper, PA-C  predniSONE (DELTASONE) 20 MG tablet Take 2 tablets (40 mg total) by mouth daily. Take 40 mg by mouth daily for 3 days, then  by mouth daily for 3 days, then  daily for 3 days 10/20/14  Garlon HatchetLisa M Sanders, PA-C   BP 106/60 mmHg  Pulse 88  Temp(Src) 98 F (36.7 C) (Oral)  Resp 20  SpO2 98% Physical Exam  Constitutional: She is oriented to person, place, and time. She appears well-developed and well-nourished. No distress.  HENT:  Head: Normocephalic and atraumatic.  Eyes: Conjunctivae and EOM are normal.  Neck: Normal range of motion.  Cardiovascular: Normal rate and  regular rhythm.  Exam reveals no gallop and no friction rub.   No murmur heard. Pulmonary/Chest: Effort normal and breath sounds normal. She has no wheezes. She has no rales. She exhibits no tenderness.  Abdominal: Soft. She exhibits no distension. There is no tenderness. There is no rebound.  Musculoskeletal: Normal range of motion.  Neurological: She is alert and oriented to person, place, and time. Coordination normal.  Speech is goal-oriented. Moves limbs without ataxia.   Skin: Skin is warm and dry.  Psychiatric: She has a normal mood and affect. Her behavior is normal.  Odd behavior  Nursing note and vitals reviewed.   ED Course  Procedures (including critical care time) Labs Review Labs Reviewed - No data to display  Imaging Review No results found.   EKG Interpretation None      MDM   Final diagnoses:  Pain in both feet  URI (upper respiratory infection)    9:09 AM  Patient will give a stool sample. I do not see any worms as patient is describing. Vitals stable and patient afebrile.  9:47 AM Patient gave a stool sample. Patient will be contacted in 1-2 days if positive. Patient will have prednisone for breathing and tramadol for pain. Vitals stable and patient afebrile. No further evaluation needed at this time.   Emilia BeckKaitlyn Kailei Cowens, PA-C 01/15/15 1554  Toy BakerAnthony T Allen, MD 01/18/15 [phone]

## 2015-01-13 NOTE — ED Notes (Signed)
Patient states she has trichonosis and "my belly is just real big and bloated".  Patient complains of abdominal pain.   Patient states "this is tapeworms.  I can feel them moving in my feet".

## 2015-01-13 NOTE — ED Notes (Signed)
Pt requesting Spireva Rx and Ibuprofen 800mg  Rx. PA informed.

## 2015-01-14 LAB — OVA AND PARASITE EXAMINATION
OVA AND PARASITES: NONE SEEN
Special Requests: NORMAL

## 2015-03-01 ENCOUNTER — Emergency Department (HOSPITAL_COMMUNITY)
Admission: EM | Admit: 2015-03-01 | Discharge: 2015-03-01 | Disposition: A | Discharge status: Home / Self Care | Payer: Self-pay | Attending: Emergency Medicine | Admitting: Emergency Medicine

## 2015-03-01 DIAGNOSIS — J45909 Unspecified asthma, uncomplicated: Secondary | ICD-10-CM | POA: Insufficient documentation

## 2015-03-01 DIAGNOSIS — Z791 Long term (current) use of non-steroidal anti-inflammatories (NSAID): Secondary | ICD-10-CM | POA: Insufficient documentation

## 2015-03-01 DIAGNOSIS — F1410 Cocaine abuse, uncomplicated: Secondary | ICD-10-CM | POA: Insufficient documentation

## 2015-03-01 DIAGNOSIS — Z79899 Other long term (current) drug therapy: Secondary | ICD-10-CM | POA: Insufficient documentation

## 2015-03-01 DIAGNOSIS — F10121 Alcohol abuse with intoxication delirium: Secondary | ICD-10-CM | POA: Insufficient documentation

## 2015-03-01 DIAGNOSIS — Z720 Tobacco use: Secondary | ICD-10-CM | POA: Insufficient documentation

## 2015-03-01 DIAGNOSIS — F1210 Cannabis abuse, uncomplicated: Secondary | ICD-10-CM | POA: Insufficient documentation

## 2015-03-01 LAB — CBC WITH DIFFERENTIAL/PLATELET
BASOS ABS: 0 10*3/uL (ref 0.0–0.1)
BASOS PCT: 0 % (ref 0–1)
Eosinophils Absolute: 0.1 10*3/uL (ref 0.0–0.7)
Eosinophils Relative: 1 % (ref 0–5)
HCT: 45.4 % (ref 36.0–46.0)
HEMOGLOBIN: 15.5 g/dL — AB (ref 12.0–15.0)
LYMPHS ABS: 2.3 10*3/uL (ref 0.7–4.0)
Lymphocytes Relative: 25 % (ref 12–46)
MCH: 32.3 pg (ref 26.0–34.0)
MCHC: 34.1 g/dL (ref 30.0–36.0)
MCV: 94.6 fL (ref 78.0–100.0)
Monocytes Absolute: 0.5 10*3/uL (ref 0.1–1.0)
Monocytes Relative: 5 % (ref 3–12)
Neutro Abs: 6.5 10*3/uL (ref 1.7–7.7)
Neutrophils Relative %: 69 % (ref 43–77)
Platelets: 201 10*3/uL (ref 150–400)
RBC: 4.8 MIL/uL (ref 3.87–5.11)
RDW: 12.6 % (ref 11.5–15.5)
WBC: 9.4 10*3/uL (ref 4.0–10.5)

## 2015-03-01 LAB — RAPID URINE DRUG SCREEN, HOSP PERFORMED
AMPHETAMINES: NOT DETECTED
BARBITURATES: NOT DETECTED
Benzodiazepines: NOT DETECTED
Cocaine: POSITIVE — AB
OPIATES: NOT DETECTED
Tetrahydrocannabinol: POSITIVE — AB

## 2015-03-01 LAB — BASIC METABOLIC PANEL
ANION GAP: 10 (ref 5–15)
BUN: 13 mg/dL (ref 6–23)
CHLORIDE: 109 mmol/L (ref 96–112)
CO2: 23 mmol/L (ref 19–32)
Calcium: 8.7 mg/dL (ref 8.4–10.5)
Creatinine, Ser: 0.83 mg/dL (ref 0.50–1.10)
GFR calc non Af Amer: 86 mL/min — ABNORMAL LOW (ref >90)
GLUCOSE: 100 mg/dL — AB (ref 70–99)
Potassium: 3.8 mmol/L (ref 3.5–5.1)
Sodium: 142 mmol/L (ref 135–145)

## 2015-03-01 LAB — ETHANOL: Alcohol, Ethyl (B): 290 mg/dL — ABNORMAL HIGH (ref 0–9)

## 2015-03-01 MED ORDER — ZIPRASIDONE MESYLATE 20 MG IM SOLR
INTRAMUSCULAR | Status: AC
Start: 2015-03-01 — End: 2015-03-01
  Administered 2015-03-01: 20 mg via INTRAMUSCULAR
  Filled 2015-03-01: qty 20

## 2015-03-01 MED ORDER — HALOPERIDOL LACTATE 5 MG/ML IJ SOLN
5.0000 mg | Freq: Once | INTRAMUSCULAR | Status: AC
  Administered 2015-03-01: 5 mg via INTRAMUSCULAR

## 2015-03-01 MED ORDER — ZIPRASIDONE MESYLATE 20 MG IM SOLR
20.0000 mg | Freq: Once | INTRAMUSCULAR | Status: AC
  Administered 2015-03-01: 20 mg via INTRAMUSCULAR

## 2015-03-01 MED ORDER — HALOPERIDOL LACTATE 5 MG/ML IJ SOLN
5.0000 mg | Freq: Once | INTRAMUSCULAR | Status: DC
Start: 2015-03-01 — End: 2015-03-01

## 2015-03-01 MED ORDER — STERILE WATER FOR INJECTION IJ SOLN
INTRAMUSCULAR | Status: AC
  Administered 2015-03-01: 15:00:00
  Filled 2015-03-01: qty 10

## 2015-03-01 MED ORDER — LORAZEPAM 2 MG/ML IJ SOLN: 2.0000 mg | Freq: Once | INTRAMUSCULAR | Status: DC

## 2015-03-01 MED ORDER — LORAZEPAM 2 MG/ML IJ SOLN
2.0000 mg | Freq: Once | INTRAMUSCULAR | Status: AC
  Administered 2015-03-01: 2 mg via INTRAMUSCULAR
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
## 2015-03-01 NOTE — ED Notes (Signed)
Patient belongings (underwear, bra, socks, and pants) placed in locker 27.
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
## 2015-03-01 NOTE — ED Notes (Signed)
MD at bedside. EDP JAMES EVALUATED THIS PT UPON ARRIVAL TO ROOM
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
## 2015-03-01 NOTE — Discharge Instructions (Signed)
Alcohol Intoxication °Alcohol intoxication occurs when you drink enough alcohol that it affects your ability to function. It can be mild or very severe. Drinking a lot of alcohol in a short time is called binge drinking. This can be very harmful. Drinking alcohol can also be more dangerous if you are taking medicines or other drugs. Some of the effects caused by alcohol may include: °· Loss of coordination. °· Changes in mood and behavior. °· Unclear thinking. °· Trouble talking (slurred speech). °· Throwing up (vomiting). °· Confusion. °· Slowed breathing. °· Twitching and shaking (seizures). °· Loss of consciousness. °HOME CARE °· Do not drive after drinking alcohol. °· Drink enough water and fluids to keep your pee (urine) clear or pale yellow. Avoid caffeine. °· Only take medicine as told by your doctor. °GET HELP IF: °· You throw up (vomit) many times. °· You do not feel better after a few days. °· You frequently have alcohol intoxication. Your doctor can help decide if you should see a substance use treatment counselor. °GET HELP RIGHT AWAY IF: °· You become shaky when you stop drinking. °· You have twitching and shaking. °· You throw up blood. It may look bright red or like coffee grounds. °· You notice blood in your poop (bowel movements). °· You become lightheaded or pass out (faint). °MAKE SURE YOU:  °· Understand these instructions. °· Will watch your condition. °· Will get help right away if you are not doing well or get worse. °Document Released: 05/30/2008 Document Revised: 08/14/2013 Document Reviewed: 05/17/2013 °[employer]® Patient Information ©2015 [employer]. This information is not intended to replace advice given to you by your health care provider. Make sure you discuss any questions you have with your health care provider. ° °

## 2015-03-01 NOTE — Progress Notes (Signed)
Pt resting in bed with eyes closed. No distress noted. Will continue to monitor closely.  

## 2015-03-01 NOTE — ED Notes (Signed)
Bed: RESB Expected date: 03/01/15 Expected time: 1:33 PM Means of arrival: Ambulance Comments: ETOH

## 2015-05-08 ENCOUNTER — Ambulatory Visit: Payer: Self-pay | Admitting: Internal Medicine
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
## 2015-06-07 ENCOUNTER — Emergency Department (HOSPITAL_COMMUNITY)
Admission: EM | Admit: 2015-06-07 | Discharge: 2015-06-07 | Disposition: A | Discharge status: Home / Self Care | Payer: Self-pay | Attending: Emergency Medicine | Admitting: Emergency Medicine

## 2015-06-07 ENCOUNTER — Encounter (HOSPITAL_COMMUNITY): Payer: Self-pay | Admitting: Emergency Medicine

## 2015-06-07 DIAGNOSIS — Z720 Tobacco use: Secondary | ICD-10-CM | POA: Insufficient documentation

## 2015-06-07 DIAGNOSIS — J45909 Unspecified asthma, uncomplicated: Secondary | ICD-10-CM | POA: Insufficient documentation

## 2015-06-07 DIAGNOSIS — B880 Other acariasis: Secondary | ICD-10-CM | POA: Insufficient documentation

## 2015-06-07 DIAGNOSIS — Z79899 Other long term (current) drug therapy: Secondary | ICD-10-CM | POA: Insufficient documentation

## 2015-06-07 DIAGNOSIS — Z791 Long term (current) use of non-steroidal anti-inflammatories (NSAID): Secondary | ICD-10-CM | POA: Insufficient documentation

## 2015-06-07 DIAGNOSIS — Z792 Long term (current) use of antibiotics: Secondary | ICD-10-CM | POA: Insufficient documentation

## 2015-06-07 MED ORDER — HYDROCORTISONE 2.5 % EX LOTN: TOPICAL_LOTION | Freq: Two times a day (BID) | CUTANEOUS | Status: DC

## 2015-06-07 NOTE — ED Notes (Signed)
Declined W/C at D/C and was escorted to lobby by RN. 

## 2015-06-07 NOTE — ED Provider Notes (Signed)
CSN: [id number]     Arrival date & time 06/07/15  1340 History  This chart was scribed for Marlon Pel, PA-C, working with Purvis Sheffield, MD by Chestine Spore, ED Scribe. The patient was seen in room TR08C/TR08C at 3:36 PM.    Chief Complaint  Patient presents with  . Rash      The history is provided by the patient. No language interpreter was used.     HPI Comments: Emily Whitehead is a 44 y.o. female who presents to the Emergency Department complaining of rash onset 2-3 days. She reports that she has a rash on her right shoulder that itches. She reports that she walked by a chigger bush recently. Her significant other who is present was working around [employer] as well and has the same rash. She states that she has not tried any medications for the relief of her symptoms. She denies fever, HA, n/v/d, and any other symptoms.  Past Medical History  Diagnosis Date  . Asthma    Past Surgical History  Procedure Laterality Date  . Dnc     Family History  Problem Relation Age of Onset  . Cancer Mother     ovarian cancer    History  Substance Use Topics  . Smoking status: Current Every Day Smoker -- 0.25 packs/day    Types: Cigarettes  . Smokeless tobacco: Never Used  . Alcohol Use: No   OB History    No data available     Review of Systems  Constitutional: Negative for fever.  Gastrointestinal: Negative for nausea, vomiting and diarrhea.  Skin: Positive for rash (right shoulder). Negative for color change.  Neurological: Negative for headaches.      Allergies  Aspirin  Home Medications   Prior to Admission medications   Medication Sig Start Date End Date Taking? Authorizing Provider  albuterol (PROVENTIL HFA;VENTOLIN HFA) 108 (90 BASE) MCG/ACT inhaler Inhale 2 puffs into the lungs every 4 (four) hours as needed for shortness of breath.    Historical Provider, MD  albuterol (PROVENTIL HFA;VENTOLIN HFA) 108 (90 BASE) MCG/ACT inhaler Inhale 1-2 puffs into the  lungs every 6 (six) hours as needed for wheezing. 11/11/14   Doris Cheadle, MD  HYDROcodone-acetaminophen (NORCO/VICODIN) 5-325 MG per tablet Take 1 tablet by mouth every 6 (six) hours as needed for moderate pain. 11/06/14   Fayrene Helper, PA-C  hydrocortisone 2.5 % lotion Apply topically 2 (two) times daily. Do not put lotion on your face. 06/07/15   Aundray Cartlidge Neva Seat, PA-C  ibuprofen (ADVIL,MOTRIN) 800 MG tablet Take 1 tablet (800 mg total) by mouth 3 (three) times daily. 01/13/15   Kaitlyn Szekalski, PA-C  metroNIDAZOLE (FLAGYL) 500 MG tablet Take 1 tablet (500 mg total) by mouth 2 (two) times daily. 11/18/14   Ambrose Finland, NP  naproxen (NAPROSYN) 500 MG tablet Take 1 tablet (500 mg total) by mouth 2 (two) times daily. 11/06/14   Fayrene Helper, PA-C  predniSONE (DELTASONE) 20 MG tablet Take 2 tablets (40 mg total) by mouth daily. Take 40 mg by mouth daily for 3 days, then  by mouth daily for 3 days, then  daily for 3 days 01/13/15   Emilia Beck, PA-C  tiotropium (SPIRIVA HANDIHALER) 18 MCG inhalation capsule Place 1 capsule (18 mcg total) into inhaler and inhale daily. 01/13/15   Kaitlyn Szekalski, PA-C  traMADol (ULTRAM) 50 MG tablet Take 1 tablet (50 mg total) by mouth every 6 (six) hours as needed. 01/13/15   Emilia Beck, PA-C  BP 131/78 mmHg  Pulse 80  Temp(Src) 98.1 F (36.7 C) (Oral)  Resp 16  Ht 5\' 4"  (1.626 m)  Wt 264 lb 4.8 oz (119.886 kg)  BMI 45.34 kg/m2  SpO2 98%  LMP 04/07/2015 Physical Exam  Constitutional: She is oriented to person, place, and time. She appears well-developed and well-nourished. No distress.  HENT:  Head: Normocephalic and atraumatic.  Eyes: EOM are normal.  Neck: Neck supple. No tracheal deviation present.  Cardiovascular: Normal rate.   Pulmonary/Chest: Effort normal. No respiratory distress.  Musculoskeletal: Normal range of motion.  Neurological: She is alert and oriented to person, place, and time.  Skin: Skin is warm and dry.  Red  pimple like bumps to right shoulder that are excoriated. Rash spares the rest of the body.  Psychiatric: She has a normal mood and affect. Her behavior is normal.  Nursing note and vitals reviewed.   ED Course  Procedures (including critical care time) DIAGNOSTIC STUDIES: Oxygen Saturation is 99% on RA, nl by my interpretation.    COORDINATION OF CARE: 3:37 PM-Discussed treatment plan which includes permethrin cream Rx with pt at bedside and pt agreed to plan.   Labs Review Labs Reviewed - No data to display  Imaging Review No results found.   EKG Interpretation None      MDM   Final diagnoses:  Chiggers    Treatment for chiggers is topical steroids and antihistamines. -- referral to derm and pt education given.  Medications - No data to display  44 y.o.Emily Whitehead's evaluation in the Emergency Department is complete. It has been determined that no acute conditions requiring further emergency intervention are present at this time. The patient/guardian have been advised of the diagnosis and plan. We have discussed signs and symptoms that warrant return to the ED, such as changes or worsening in symptoms.  Vital signs are stable at discharge. Filed Vitals:   06/07/15 1644  BP: 131/78  Pulse: 80  Temp: 98.1 F (36.7 C)  Resp: 16    Patient/guardian has voiced understanding and agreed to follow-up with the PCP or specialist.   I personally performed the services described in this documentation, which was scribed in my presence. The recorded information has been reviewed and is accurate.    Marlon Pel, PA-C 06/12/15 1027  Purvis Sheffield, MD 06/13/15 1018

## 2015-06-07 NOTE — Discharge Instructions (Signed)

## 2015-06-07 NOTE — ED Notes (Signed)
Pt. Stated, I've been by a chigger bush and have them all over my upper right arm

## 2015-07-20 ENCOUNTER — Emergency Department (HOSPITAL_COMMUNITY): Payer: Self-pay

## 2015-07-20 ENCOUNTER — Emergency Department (HOSPITAL_COMMUNITY)
Admission: EM | Admit: 2015-07-20 | Discharge: 2015-07-20 | Disposition: A | Discharge status: Home / Self Care | Payer: Self-pay | Attending: Emergency Medicine | Admitting: Emergency Medicine

## 2015-07-20 ENCOUNTER — Encounter: Payer: Self-pay | Admitting: Physician Assistant

## 2015-07-20 ENCOUNTER — Encounter (HOSPITAL_COMMUNITY): Payer: Self-pay | Admitting: Emergency Medicine

## 2015-07-20 ENCOUNTER — Ambulatory Visit: Payer: Self-pay | Attending: Internal Medicine | Admitting: Physician Assistant

## 2015-07-20 VITALS — BP 126/84 | HR 85 | Temp 98.6°F | Resp 18 | Ht 65.0 in | Wt 269.0 lb

## 2015-07-20 DIAGNOSIS — Z87828 Personal history of other (healed) physical injury and trauma: Secondary | ICD-10-CM | POA: Insufficient documentation

## 2015-07-20 DIAGNOSIS — M25511 Pain in right shoulder: Secondary | ICD-10-CM | POA: Insufficient documentation

## 2015-07-20 DIAGNOSIS — J45909 Unspecified asthma, uncomplicated: Secondary | ICD-10-CM | POA: Insufficient documentation

## 2015-07-20 DIAGNOSIS — Z79899 Other long term (current) drug therapy: Secondary | ICD-10-CM | POA: Insufficient documentation

## 2015-07-20 DIAGNOSIS — Z720 Tobacco use: Secondary | ICD-10-CM | POA: Insufficient documentation

## 2015-07-20 DIAGNOSIS — K219 Gastro-esophageal reflux disease without esophagitis: Secondary | ICD-10-CM | POA: Insufficient documentation

## 2015-07-20 DIAGNOSIS — Z7952 Long term (current) use of systemic steroids: Secondary | ICD-10-CM | POA: Insufficient documentation

## 2015-07-20 DIAGNOSIS — G8929 Other chronic pain: Secondary | ICD-10-CM | POA: Insufficient documentation

## 2015-07-20 DIAGNOSIS — J302 Other seasonal allergic rhinitis: Secondary | ICD-10-CM | POA: Insufficient documentation

## 2015-07-20 DIAGNOSIS — M545 Low back pain: Secondary | ICD-10-CM | POA: Insufficient documentation

## 2015-07-20 MED ORDER — NAPROXEN 500 MG PO TABS: 500.0000 mg | ORAL_TABLET | Freq: Two times a day (BID) | ORAL | Status: DC

## 2015-07-20 MED ORDER — HYDROCODONE-ACETAMINOPHEN 5-325 MG PO TABS: 1.0000 | ORAL_TABLET | Freq: Four times a day (QID) | ORAL | Status: DC | PRN

## 2015-07-20 MED ORDER — CETIRIZINE HCL 10 MG PO TABS: 10.0000 mg | ORAL_TABLET | Freq: Every day | ORAL | Status: DC

## 2015-07-20 MED ORDER — OMEPRAZOLE 40 MG PO CPDR
40.0000 mg | DELAYED_RELEASE_CAPSULE | Freq: Every day | ORAL | Status: DC
Start: 2015-07-20 — End: 2015-09-17

## 2015-07-20 MED ORDER — HYDROCODONE-ACETAMINOPHEN 5-325 MG PO TABS
1.0000 | ORAL_TABLET | Freq: Once | ORAL | Status: AC
Start: 2015-07-20 — End: 2015-07-20
  Administered 2015-07-20: 1 via ORAL
  Filled 2015-07-20: qty 1
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
## 2015-07-20 NOTE — Discharge Instructions (Signed)
Call your doctor tomorrow to tell them you came to the ED and had your x-ray. See if they are planning to schedule you follow up with the orthopedic doctor.
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
## 2015-08-04 ENCOUNTER — Telehealth: Payer: Self-pay | Admitting: General Practice

## 2015-08-04 ENCOUNTER — Encounter: Payer: Self-pay | Admitting: Internal Medicine

## 2015-08-04 ENCOUNTER — Ambulatory Visit (INDEPENDENT_AMBULATORY_CARE_PROVIDER_SITE_OTHER): Payer: Self-pay | Admitting: Family Medicine

## 2015-08-04 ENCOUNTER — Encounter: Payer: Self-pay | Admitting: Family Medicine

## 2015-08-04 VITALS — BP 140/88 | HR 78 | Ht 64.0 in | Wt 260.0 lb

## 2015-08-04 DIAGNOSIS — M25511 Pain in right shoulder: Secondary | ICD-10-CM

## 2015-08-04 MED ORDER — METHYLPREDNISOLONE ACETATE 40 MG/ML IJ SUSP
40.0000 mg | Freq: Once | INTRAMUSCULAR | Status: AC
  Administered 2015-08-04: 40 mg via INTRA_ARTICULAR

## 2015-08-04 MED ORDER — MELOXICAM 15 MG PO TABS
15.0000 mg | ORAL_TABLET | Freq: Every day | ORAL | Status: DC
Start: 2015-08-04 — End: 2015-09-17
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
## 2015-08-04 NOTE — Telephone Encounter (Signed)
HYDROcodone-acetaminophen (NORCO/VICODIN) 5-325 MG per tablet ° °

## 2015-08-05 DIAGNOSIS — M25511 Pain in right shoulder: Secondary | ICD-10-CM | POA: Insufficient documentation
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
## 2015-08-05 NOTE — Assessment & Plan Note (Signed)
44 yo with right shoulder pain x 1 month without a definite inciting event.  Exam and u/s most consistent with subacromial bursitis.   - Referral to PT - Right subacromial injection performed with moderate relief of Emily Whitehead's discomfort.  - provided mobic  to be taken daily - f/u in 1 month

## 2015-08-12 ENCOUNTER — Ambulatory Visit: Payer: No Typology Code available for payment source | Admitting: Internal Medicine

## 2015-08-13 ENCOUNTER — Ambulatory Visit: Payer: No Typology Code available for payment source | Admitting: Internal Medicine

## 2015-08-18 ENCOUNTER — Ambulatory Visit: Payer: No Typology Code available for payment source | Admitting: Family Medicine

## 2015-09-08 ENCOUNTER — Ambulatory Visit: Payer: No Typology Code available for payment source | Admitting: Family Medicine

## 2015-09-17 ENCOUNTER — Emergency Department (HOSPITAL_COMMUNITY): Payer: No Typology Code available for payment source

## 2015-09-17 ENCOUNTER — Encounter (HOSPITAL_COMMUNITY): Payer: Self-pay

## 2015-09-17 ENCOUNTER — Emergency Department (HOSPITAL_COMMUNITY)
Admission: EM | Admit: 2015-09-17 | Discharge: 2015-09-17 | Disposition: A | Discharge status: Home / Self Care | Payer: No Typology Code available for payment source | Attending: Emergency Medicine | Admitting: Emergency Medicine

## 2015-09-17 DIAGNOSIS — J45901 Unspecified asthma with (acute) exacerbation: Secondary | ICD-10-CM | POA: Insufficient documentation

## 2015-09-17 DIAGNOSIS — Z79899 Other long term (current) drug therapy: Secondary | ICD-10-CM | POA: Insufficient documentation

## 2015-09-17 DIAGNOSIS — J209 Acute bronchitis, unspecified: Secondary | ICD-10-CM

## 2015-09-17 DIAGNOSIS — Z720 Tobacco use: Secondary | ICD-10-CM | POA: Insufficient documentation

## 2015-09-17 DIAGNOSIS — M791 Myalgia: Secondary | ICD-10-CM | POA: Insufficient documentation

## 2015-09-17 LAB — CBC WITH DIFFERENTIAL/PLATELET
Basophils Absolute: 0 10*3/uL (ref 0.0–0.1)
Basophils Relative: 0 %
EOS ABS: 0.1 10*3/uL (ref 0.0–0.7)
EOS PCT: 1 %
HCT: 40.8 % (ref 36.0–46.0)
Hemoglobin: 13.8 g/dL (ref 12.0–15.0)
Lymphocytes Relative: 20 %
Lymphs Abs: 1.1 10*3/uL (ref 0.7–4.0)
MCH: 31.5 pg (ref 26.0–34.0)
MCHC: 33.8 g/dL (ref 30.0–36.0)
MCV: 93.2 fL (ref 78.0–100.0)
MONO ABS: 0.6 10*3/uL (ref 0.1–1.0)
Monocytes Relative: 10 %
Neutro Abs: 3.8 10*3/uL (ref 1.7–7.7)
Neutrophils Relative %: 69 %
PLATELETS: 156 10*3/uL (ref 150–400)
RBC: 4.38 MIL/uL (ref 3.87–5.11)
RDW: 12.5 % (ref 11.5–15.5)
WBC: 5.5 10*3/uL (ref 4.0–10.5)

## 2015-09-17 LAB — BASIC METABOLIC PANEL
Anion gap: 8 (ref 5–15)
BUN: 15 mg/dL (ref 6–20)
CO2: 24 mmol/L (ref 22–32)
CREATININE: 0.77 mg/dL (ref 0.44–1.00)
Calcium: 9 mg/dL (ref 8.9–10.3)
Chloride: 106 mmol/L (ref 101–111)
GFR calc Af Amer: >60 mL/min (ref >60)
GLUCOSE: 86 mg/dL (ref 65–99)
Potassium: 4 mmol/L (ref 3.5–5.1)
SODIUM: 138 mmol/L (ref 135–145)

## 2015-09-17 LAB — I-STAT BETA HCG BLOOD, ED (MC, WL, AP ONLY)

## 2015-09-17 MED ORDER — PREDNISONE 20 MG PO TABS: 40.0000 mg | ORAL_TABLET | Freq: Every day | ORAL | Status: DC

## 2015-09-17 MED ORDER — NAPROXEN 250 MG PO TABS: 250.0000 mg | ORAL_TABLET | Freq: Two times a day (BID) | ORAL | Status: DC

## 2015-09-17 MED ORDER — ALBUTEROL SULFATE HFA 108 (90 BASE) MCG/ACT IN AERS: 2.0000 | INHALATION_SPRAY | RESPIRATORY_TRACT | Status: DC | PRN

## 2015-09-17 MED ORDER — IPRATROPIUM-ALBUTEROL 0.5-2.5 (3) MG/3ML IN SOLN
3.0000 mL | Freq: Once | RESPIRATORY_TRACT | Status: AC
  Administered 2015-09-17: 3 mL via RESPIRATORY_TRACT
  Filled 2015-09-17: qty 3

## 2015-09-17 MED ORDER — BENZONATATE 100 MG PO CAPS: 100.0000 mg | ORAL_CAPSULE | Freq: Three times a day (TID) | ORAL | Status: DC

## 2015-09-17 MED ORDER — ALBUTEROL SULFATE HFA 108 (90 BASE) MCG/ACT IN AERS
2.0000 | INHALATION_SPRAY | Freq: Once | RESPIRATORY_TRACT | Status: AC
  Administered 2015-09-17: 2 via RESPIRATORY_TRACT
  Filled 2015-09-17: qty 6.7

## 2015-09-17 MED ORDER — SODIUM CHLORIDE 0.9 % IV BOLUS (SEPSIS)
1000.0000 mL | Freq: Once | INTRAVENOUS | Status: AC
  Administered 2015-09-17: 1000 mL via INTRAVENOUS

## 2015-09-17 NOTE — ED Notes (Signed)
Patient in no distress at this time. She is playing bejeweled on her cell phone during assessment.

## 2015-09-17 NOTE — ED Provider Notes (Signed)
CSN: [id number]     Arrival date & time 09/17/15  1407 History   First MD Initiated Contact with Patient 09/17/15 1558     Chief Complaint  Patient presents with  . Dizziness   Emily Whitehead is a 44 y.o. female with a history of asthma who presents to the ED complaining of cold symptoms for the past 3 days. The patient reports productive cough, subjective fevers, runny nose postnasal drip, chest tightness, shortness of breath, wheezing and chills for the past 2-3 days. She reports using cough and cold medicine with minimal relief. She also reports feeling fatigued and reports an episode of diarrhea today. Patient also reports today she had some positional lightheadedness. She reports nausea due to lightheadedness earlier without vomiting. The patient reports she's had a good appetite and has been eating and drinking normally. The patient denies loss of consciousness, double vision, neck pain, back pain, vomiting, hematemesis, hematochezia, hemoptysis, abdominal pain, chest pain, or rashes.   (Consider location/radiation/quality/duration/timing/severity/associated sxs/prior Treatment) HPI  Past Medical History  Diagnosis Date  . Asthma    Past Surgical History  Procedure Laterality Date  . Dnc     Family History  Problem Relation Age of Onset  . Cancer Mother     ovarian cancer    Social History  Substance Use Topics  . Smoking status: Current Every Day Smoker -- 0.25 packs/day    Types: Cigarettes  . Smokeless tobacco: Never Used  . Alcohol Use: 1.2 oz/week    2 Cans of beer per week     Comment: ocassional   OB History    No data available     Review of Systems  Constitutional: Positive for fever, chills and fatigue. Negative for appetite change.  HENT: Positive for congestion, postnasal drip, rhinorrhea and sneezing. Negative for ear pain, hearing loss, mouth sores, sore throat and trouble swallowing.   Eyes: Negative for visual disturbance.  Respiratory: Positive for  cough, chest tightness, shortness of breath and wheezing.   Cardiovascular: Negative for chest pain and palpitations.  Gastrointestinal: Negative for nausea, vomiting, abdominal pain and diarrhea.  Genitourinary: Negative for dysuria, urgency, frequency, hematuria and flank pain.  Musculoskeletal: Positive for myalgias. Negative for back pain, neck pain and neck stiffness.  Skin: Negative for rash.  Neurological: Positive for light-headedness. Negative for dizziness, syncope, weakness, numbness and headaches.      Allergies  Aspirin  Home Medications   Prior to Admission medications   Medication Sig Start Date End Date Taking? Authorizing Provider  ibuprofen (ADVIL,MOTRIN) 800 MG tablet Take 800 mg by mouth every 8 (eight) hours as needed for headache, mild pain or moderate pain.    Yes Historical Provider, MD  albuterol (PROVENTIL HFA;VENTOLIN HFA) 108 (90 BASE) MCG/ACT inhaler Inhale 2 puffs into the lungs every 4 (four) hours as needed for wheezing or shortness of breath. 09/17/15   Everlene Farrier, PA-C  benzonatate (TESSALON) 100 MG capsule Take 1 capsule (100 mg total) by mouth every 8 (eight) hours. 09/17/15   Everlene Farrier, PA-C  naproxen (NAPROSYN) 250 MG tablet Take 1 tablet (250 mg total) by mouth 2 (two) times daily with a meal. 09/17/15   Everlene Farrier, PA-C  predniSONE (DELTASONE) 20 MG tablet Take 2 tablets (40 mg total) by mouth daily. 09/17/15   Everlene Farrier, PA-C   BP 124/77 mmHg  Pulse 73  Temp(Src) 98.3 F (36.8 C) (Oral)  Resp 20  SpO2 98% Physical Exam  Constitutional: She is oriented to person, place,  and time. She appears well-developed and well-nourished. No distress.  Nontoxic appearing.  HENT:  Head: Normocephalic and atraumatic.  Right Ear: External ear normal.  Left Ear: External ear normal.  Mouth/Throat: Oropharynx is clear and moist.  Mild posterior oropharyngeal erythema without edema. No tonsillar hypertrophy or exudates.  Eyes: Conjunctivae and  EOM are normal. Pupils are equal, round, and reactive to light. Right eye exhibits no discharge. Left eye exhibits no discharge.  Neck: Normal range of motion. Neck supple. No tracheal deviation present.  Cardiovascular: Normal rate, regular rhythm, normal heart sounds and intact distal pulses.  Exam reveals no gallop and no friction rub.   No murmur heard. Pulmonary/Chest: Effort normal. No respiratory distress. She has no rales.  Lung sounds mildly diminished to her bilateral bases. Patient speaking in full sentences.   Abdominal: Soft. There is no tenderness. There is no guarding.  Musculoskeletal: She exhibits no edema or tenderness.  No lower extremity edema or tenderness.  Lymphadenopathy:    She has no cervical adenopathy.  Neurological: She is alert and oriented to person, place, and time. No cranial nerve deficit. Coordination normal.  Skin: Skin is warm and dry. No rash noted. She is not diaphoretic. No erythema. No pallor.  Psychiatric: She has a normal mood and affect. Her behavior is normal.  Nursing note and vitals reviewed.   ED Course  Procedures (including critical care time) Labs Review Labs Reviewed  BASIC METABOLIC PANEL  CBC WITH DIFFERENTIAL/PLATELET  I-STAT BETA HCG BLOOD, ED ([facility], [facility], [facility] ONLY)    Imaging Review Dg Chest 2 View  09/17/2015   CLINICAL DATA:  Nausea and dizziness.  Current smoker.  EXAM: CHEST  2 VIEW  COMPARISON:  10/20/2014.  FINDINGS: Normal sized heart. Clear lungs. Mild diffuse peribronchial thickening. Unremarkable bones.  IMPRESSION: Mild chronic bronchitic changes.  No acute abnormality.   Electronically Signed   By: Beckie Salts M.D.   On: 09/17/2015 17:09   I have personally reviewed and evaluated these images and lab results as part of my medical decision-making.   EKG Interpretation None      Filed Vitals:   09/17/15 1424 09/17/15 1657  BP: 137/86 124/77  Pulse: 68 73  Temp: 98.3 F (36.8 C)   TempSrc: Oral   Resp: 18 20   SpO2: 98% 98%     MDM   Meds given in ED:  Medications  sodium chloride 0.9 % bolus 1,000 mL (0 mLs Intravenous Stopped 09/17/15 1733)  ipratropium-albuterol (DUONEB) 0.5-2.5 (3) MG/3ML nebulizer solution 3 mL (3 mLs Nebulization Given 09/17/15 1632)  albuterol (PROVENTIL HFA;VENTOLIN HFA) 108 (90 BASE) MCG/ACT inhaler 2 puff (2 puffs Inhalation Given 09/17/15 1840)    Discharge Medication List as of 09/17/2015  6:35 PM    START taking these medications   Details  benzonatate (TESSALON) 100 MG capsule Take 1 capsule (100 mg total) by mouth every 8 (eight) hours., Starting 09/17/2015, Until Discontinued, Print        Final diagnoses:  Acute bronchitis, unspecified organism   This  is a 44 y.o. female with a history of asthma who presents to the ED complaining of cold symptoms for the past 3 days. The patient reports productive cough, subjective fevers, runny nose postnasal drip, chest tightness, shortness of breath, wheezing and chills for the past 2-3 days. She reports using cough and cold medicine with minimal relief.  On exam the patient is afebrile nontoxic appearing. Lung sounds are generally diminished in her bilateral bases. Her  abdomen is soft and nontender to palpation. No lower extremities edema or tenderness. We'll check basic blood work and chest x-ray and provide with breathing treatment. BMP and CBC are within normal limits. No leukocytosis. I stat hCG is negative. Chest x-ray shows mild chronic bronchitic changes with no acute abnormality. Had reevaluation after fluid bolus and DuoNeb treatment patient reports she is feeling almost back to "100%." She reports that her chest tightness and wheezing has completely resolved. Her repeat lung exam is improved. We'll discharge with prescriptions for albuterol inhaler, Tessalon Perles, 5 day of prednisone and naproxen for pain control. Encourage close follow-up with primary care and strict return precautions. I advised the patient to  follow-up with their primary care provider this week. I advised the patient to return to the emergency department with new or worsening symptoms or new concerns. The patient verbalized understanding and agreement with plan.       Everlene Farrier, PA-C 09/17/15 2014  Melene Plan, DO 09/18/15 0003

## 2015-09-17 NOTE — ED Notes (Signed)
Nurse drawing labs. 

## 2015-09-17 NOTE — Discharge Instructions (Signed)

## 2015-09-17 NOTE — ED Notes (Addendum)
Per EMS, pt feeling nauseated and dizziness starting 30 min prior to arrival.  Also c/o cough and cold like symptoms x 2-3 days.  Pt states she feels hot and cold chills.  Vitals: 144/97, 95% ra, hr 95, resp 16

## 2015-10-12 ENCOUNTER — Encounter (HOSPITAL_COMMUNITY): Payer: Self-pay | Admitting: Family Medicine

## 2015-10-12 ENCOUNTER — Emergency Department (HOSPITAL_COMMUNITY)
Admission: EM | Admit: 2015-10-12 | Discharge: 2015-10-12 | Disposition: A | Discharge status: Home / Self Care | Payer: No Typology Code available for payment source | Attending: Emergency Medicine | Admitting: Emergency Medicine

## 2015-10-12 DIAGNOSIS — J45901 Unspecified asthma with (acute) exacerbation: Secondary | ICD-10-CM

## 2015-10-12 DIAGNOSIS — Z720 Tobacco use: Secondary | ICD-10-CM | POA: Insufficient documentation

## 2015-10-12 DIAGNOSIS — Z79899 Other long term (current) drug therapy: Secondary | ICD-10-CM | POA: Insufficient documentation

## 2015-10-12 MED ORDER — HYDROCODONE-HOMATROPINE 5-1.5 MG/5ML PO SYRP
5.0000 mL | ORAL_SOLUTION | Freq: Once | ORAL | Status: AC
  Administered 2015-10-12: 5 mL via ORAL
  Filled 2015-10-12: qty 5

## 2015-10-12 MED ORDER — ALBUTEROL SULFATE (2.5 MG/3ML) 0.083% IN NEBU
2.5000 mg | INHALATION_SOLUTION | Freq: Once | RESPIRATORY_TRACT | Status: AC
  Administered 2015-10-12: 2.5 mg via RESPIRATORY_TRACT
  Filled 2015-10-12: qty 3

## 2015-10-12 MED ORDER — PREDNISONE 10 MG (21) PO TBPK: 10.0000 mg | ORAL_TABLET | Freq: Every day | ORAL | Status: DC

## 2015-10-12 MED ORDER — ALBUTEROL SULFATE HFA 108 (90 BASE) MCG/ACT IN AERS
2.0000 | INHALATION_SPRAY | RESPIRATORY_TRACT | Status: DC
  Administered 2015-10-12: 2 via RESPIRATORY_TRACT
  Filled 2015-10-12 (×2): qty 6.7

## 2015-10-12 MED ORDER — AZITHROMYCIN 250 MG PO TABS
500.0000 mg | ORAL_TABLET | Freq: Once | ORAL | Status: AC
  Administered 2015-10-12: 500 mg via ORAL
  Filled 2015-10-12: qty 2

## 2015-10-12 MED ORDER — AZITHROMYCIN 250 MG PO TABS: 250.0000 mg | ORAL_TABLET | Freq: Every day | ORAL | Status: DC

## 2015-10-12 NOTE — ED Provider Notes (Signed)
CSN: [id number]     Arrival date & time 10/12/15  1051 History  By signing my name below, I, Essence Howell, attest that this documentation has been prepared under the direction and in the presence of Catha GosselinHanna Patel-Mills, PA-C Electronically Signed: Charline BillsEssence Howell, ED Scribe 10/12/2015 at 11:19 AM.   Chief Complaint  Patient presents with  . Bronchitis   The history is provided by the patient. No language interpreter was used.   HPI Comments: Emily Whitehead is a 44 y.o. female, with a h/o asthma, who presents to the Emergency Department complaining of difficulty breathing for the past few days. Pt was diagnosed with bronchitis 6 days ago and discharged with steroids. She states that she recently ran out of her inhaler. She also reports associated diaphoresis and productive cough with lime green sputum that has resolved. She denies fever. Pt smokes 1-2 cigarettes daily. She denies any fever, chills. She denies any recent antibiotic use.  Past Medical History  Diagnosis Date  . Asthma    Past Surgical History  Procedure Laterality Date  . Dnc     Family History  Problem Relation Age of Onset  . Cancer Mother     ovarian cancer    Social History  Substance Use Topics  . Smoking status: Current Every Day Smoker -- 0.25 packs/day    Types: Cigarettes  . Smokeless tobacco: Never Used  . Alcohol Use: 1.2 oz/week    2 Cans of beer per week     Comment: ocassional   OB History    No data available     Review of Systems  Constitutional: Positive for diaphoresis. Negative for fever.  Respiratory: Positive for shortness of breath. Negative for cough.    Allergies  Aspirin  Home Medications   Prior to Admission medications   Medication Sig Start Date End Date Taking? Authorizing Provider  albuterol (PROVENTIL HFA;VENTOLIN HFA) 108 (90 BASE) MCG/ACT inhaler Inhale 2 puffs into the lungs every 4 (four) hours as needed for wheezing or shortness of breath. 09/17/15   Everlene FarrierWilliam Dansie,  PA-C  azithromycin (ZITHROMAX) 250 MG tablet Take 1 tablet (250 mg total) by mouth daily. Take 1 every day starting Tuesday 10/13/15 until finished. You received your first dose today in the ED. 10/12/15   Catha GosselinHanna Patel-Mills, PA-C  benzonatate (TESSALON) 100 MG capsule Take 1 capsule (100 mg total) by mouth every 8 (eight) hours. 09/17/15   Everlene FarrierWilliam Dansie, PA-C  ibuprofen (ADVIL,MOTRIN) 800 MG tablet Take 800 mg by mouth every 8 (eight) hours as needed for headache, mild pain or moderate pain.     Historical Provider, MD  naproxen (NAPROSYN) 250 MG tablet Take 1 tablet (250 mg total) by mouth 2 (two) times daily with a meal. 09/17/15   Everlene FarrierWilliam Dansie, PA-C  predniSONE (STERAPRED UNI-PAK 21 TAB) 10 MG (21) TBPK tablet Take 1 tablet (10 mg total) by mouth daily. Take 6 tabs by mouth daily  for 2 days, then 5 tabs for 2 days, then 4 tabs for 2 days, then 3 tabs for 2 days, 2 tabs for 2 days, then 1 tab by mouth daily for 2 days 10/12/15   Lorelle FormosaHanna Patel-Mills, PA-C   BP 131/67 mmHg  Pulse 75  Temp(Src) 98.1 F (36.7 C)  Resp 20  SpO2 98% Physical Exam  Constitutional: She is oriented to person, place, and time. She appears well-developed and well-nourished. No distress.  Morbidly obese.   HENT:  Head: Normocephalic and atraumatic.  No drooling.  Eyes: Conjunctivae  and EOM are normal.  Neck: Neck supple. No tracheal deviation present.  Cardiovascular: Normal rate, regular rhythm and normal heart sounds.   Pulmonary/Chest: Effort normal. No respiratory distress. She has no wheezes. She has no rhonchi.  Lungs are clear to auscultation bilaterally. No use of accessory muscles.   Musculoskeletal: Normal range of motion.  Neurological: She is alert and oriented to person, place, and time.  Skin: Skin is warm. She is diaphoretic.  Psychiatric: She has a normal mood and affect. Her behavior is normal.  Nursing note and vitals reviewed.  ED Course  Procedures (including critical care time) DIAGNOSTIC  STUDIES: Oxygen Saturation is 98% on RA, normal by my interpretation.    COORDINATION OF CARE: 11:16 AM-Discussed treatment plan which includes nebulizer treatment with pt at bedside and pt agreed to plan.   Labs Review Labs Reviewed - No data to display  Imaging Review No results found.   EKG Interpretation None      MDM   Final diagnoses:  Asthma exacerbation  Patient with history of asthma presents for shortness of breath. Her lungs are clear to auscultation bilaterally and I do not believe she needs a chest x-ray. She was given a nebulizer albuterol treatment and states she feels much better. Medications  albuterol (PROVENTIL HFA;VENTOLIN HFA) 108 (90 BASE) MCG/ACT inhaler 2 puff (2 puffs Inhalation Given 10/12/15 1135)  albuterol (PROVENTIL) (2.5 MG/3ML) 0.083% nebulizer solution 2.5 mg (2.5 mg Nebulization Given 10/12/15 1136)  HYDROcodone-homatropine (HYCODAN) 5-1.5 MG/5ML syrup 5 mL (5 mLs Oral Given 10/12/15 1135)  azithromycin (ZITHROMAX) tablet 500 mg (500 mg Oral Given 10/12/15 1136)   Due to history of smoking and recent bronchitis, I will place the patient on antibiotics and prednisone. I discussed return precautions with her as well as follow-up and she verbally agrees with the plan. Filed Vitals:   10/12/15 1219  BP: 137/77  Pulse: 79  Temp: 98.1 F (36.7 C)  Resp: 20    I personally performed the services described in this documentation, which was scribed in my presence. The recorded information has been reviewed and is accurate.     Catha Gosselin, PA-C 10/12/15 1243  Blane Ohara, MD 10/13/15 1600

## 2015-10-12 NOTE — Discharge Instructions (Signed)
Asthma, Acute Bronchospasm Follow-up with her primary care physician. Return for any shortness of breath or difficulty breathing. Acute bronchospasm caused by asthma is also referred to as an asthma attack. Bronchospasm means your air passages become narrowed. The narrowing is caused by inflammation and tightening of the muscles in the air tubes (bronchi) in your lungs. This can make it hard to breathe or cause you to wheeze and cough. CAUSES Possible triggers are:  Animal dander from the skin, hair, or feathers of animals.  Dust mites contained in house dust.  Cockroaches.  Pollen from trees or grass.  Mold.  Cigarette or tobacco smoke.  Air pollutants such as dust, household cleaners, hair sprays, aerosol sprays, paint fumes, strong chemicals, or strong odors.  Cold air or weather changes. Cold air may trigger inflammation. Winds increase molds and pollens in the air.  Strong emotions such as crying or laughing hard.  Stress.  Certain medicines such as aspirin or beta-blockers.  Sulfites in foods and drinks, such as dried fruits and wine.  Infections or inflammatory conditions, such as a flu, cold, or inflammation of the nasal membranes (rhinitis).  Gastroesophageal reflux disease (GERD). GERD is a condition where stomach acid backs up into your esophagus.  Exercise or strenuous activity. SIGNS AND SYMPTOMS   Wheezing.  Excessive coughing, particularly at night.  Chest tightness.  Shortness of breath. DIAGNOSIS  Your health care provider will ask you about your medical history and perform a physical exam. A chest X-ray or blood testing may be performed to look for other causes of your symptoms or other conditions that may have triggered your asthma attack. TREATMENT  Treatment is aimed at reducing inflammation and opening up the airways in your lungs. Most asthma attacks are treated with inhaled medicines. These include quick relief or rescue medicines (such as  bronchodilators) and controller medicines (such as inhaled corticosteroids). These medicines are sometimes given through an inhaler or a nebulizer. Systemic steroid medicine taken by mouth or given through an IV tube also can be used to reduce the inflammation when an attack is moderate or severe. Antibiotic medicines are only used if a bacterial infection is present.  HOME CARE INSTRUCTIONS   Rest.  Drink plenty of liquids. This helps the mucus to remain thin and be easily coughed up. Only use caffeine in moderation and do not use alcohol until you have recovered from your illness.  Do not smoke. Avoid being exposed to secondhand smoke.  You play a critical role in keeping yourself in good health. Avoid exposure to things that cause you to wheeze or to have breathing problems.  Keep your medicines up-to-date and available. Carefully follow your health care provider's treatment plan.  Take your medicine exactly as prescribed.  When pollen or pollution is bad, keep windows closed and use an air conditioner or go to places with air conditioning.  Asthma requires careful medical care. See your health care provider for a follow-up as advised. If you are more than [redacted] weeks pregnant and you were prescribed any new medicines, let your obstetrician know about the visit and how you are doing. Follow up with your health care provider as directed.  After you have recovered from your asthma attack, make an appointment with your outpatient doctor to talk about ways to reduce the likelihood of future attacks. If you do not have a doctor who manages your asthma, make an appointment with a primary care doctor to discuss your asthma. SEEK IMMEDIATE MEDICAL CARE IF:  You are getting worse.  You have trouble breathing. If severe, call your local emergency services (911 in the [place]  You develop chest pain or discomfort.  You are vomiting.  You are not able to keep fluids down.  You are coughing up  yellow, green, brown, or bloody sputum.  You have a fever and your symptoms suddenly get worse.  You have trouble swallowing. MAKE SURE YOU:   Understand these instructions.  Will watch your condition.  Will get help right away if you are not doing well or get worse.   This information is not intended to replace advice given to you by your health care provider. Make sure you discuss any questions you have with your health care provider.   Document Released: 03/29/2007 Document Revised: 12/17/2013 Document Reviewed: 06/19/2013 Elsevier Interactive Patient Education [employer]

## 2015-10-12 NOTE — ED Notes (Signed)
Pt here for bronchitis. sts she ran out of her inhaler.

## 2015-10-13 ENCOUNTER — Emergency Department (HOSPITAL_COMMUNITY)
Admission: EM | Admit: 2015-10-13 | Discharge: 2015-10-13 | Disposition: A | Discharge status: Home / Self Care | Payer: No Typology Code available for payment source | Attending: Emergency Medicine | Admitting: Emergency Medicine

## 2015-10-13 ENCOUNTER — Encounter (HOSPITAL_COMMUNITY): Payer: Self-pay

## 2015-10-13 ENCOUNTER — Emergency Department (HOSPITAL_COMMUNITY): Payer: No Typology Code available for payment source

## 2015-10-13 DIAGNOSIS — Y9389 Activity, other specified: Secondary | ICD-10-CM | POA: Insufficient documentation

## 2015-10-13 DIAGNOSIS — Y929 Unspecified place or not applicable: Secondary | ICD-10-CM | POA: Insufficient documentation

## 2015-10-13 DIAGNOSIS — Z7952 Long term (current) use of systemic steroids: Secondary | ICD-10-CM | POA: Insufficient documentation

## 2015-10-13 DIAGNOSIS — W2209XA Striking against other stationary object, initial encounter: Secondary | ICD-10-CM | POA: Insufficient documentation

## 2015-10-13 DIAGNOSIS — Z720 Tobacco use: Secondary | ICD-10-CM | POA: Insufficient documentation

## 2015-10-13 DIAGNOSIS — R2689 Other abnormalities of gait and mobility: Secondary | ICD-10-CM | POA: Insufficient documentation

## 2015-10-13 DIAGNOSIS — S0083XA Contusion of other part of head, initial encounter: Secondary | ICD-10-CM | POA: Insufficient documentation

## 2015-10-13 DIAGNOSIS — H9190 Unspecified hearing loss, unspecified ear: Secondary | ICD-10-CM | POA: Insufficient documentation

## 2015-10-13 DIAGNOSIS — Y999 Unspecified external cause status: Secondary | ICD-10-CM | POA: Insufficient documentation

## 2015-10-13 DIAGNOSIS — Z79899 Other long term (current) drug therapy: Secondary | ICD-10-CM | POA: Insufficient documentation

## 2015-10-13 DIAGNOSIS — J45909 Unspecified asthma, uncomplicated: Secondary | ICD-10-CM | POA: Insufficient documentation

## 2015-10-13 DIAGNOSIS — Z792 Long term (current) use of antibiotics: Secondary | ICD-10-CM | POA: Insufficient documentation

## 2015-10-13 DIAGNOSIS — Z791 Long term (current) use of non-steroidal anti-inflammatories (NSAID): Secondary | ICD-10-CM | POA: Insufficient documentation

## 2015-10-13 NOTE — ED Provider Notes (Signed)
CSN: [id number]     Arrival date & time 10/13/15  1612 History  By signing my name below, I, Tanda RockersMargaux Venter, attest that this documentation has been prepared under the direction and in the presence of Langston MaskerKaren Sofia, [place]-C.  Electronically Signed: Tanda RockersMargaux Venter, ED Scribe. 10/13/2015. 5:14 PM.  Chief Complaint  Patient presents with  . Head Injury   The history is provided by the patient. No language interpreter was used.     HPI Comments: Emily Whitehead is a 44 y.o. female brought in by ambulance, who presents to the Emergency Department complaining of sudden onset, constant, moderate, frontal head pain s/p hitting head against 4x4 beam approximately 50 minutes ago. No LOC. Pt's boyfriend states pt hit her head pretty hard on the 4x4. Pt stood up immediately after the incident and began feeling dizzy and nauseous. Pt states her nausea has subsided some but she believes it is due to her laying still in the bed. She also complains of mild blurry vision at the moment. She reports that after the incident she had some difficulty hearing but that is has resolved. Boyfriend notes that pt had a very slow, wobby walk as well. She denies lightheadedness, vomiting, numbness, weakness, tingling, or any other associated symptoms. Pt is current every day smoker who smokes 1-2 cigarettes per day. Occasional EtOH usage.    Past Medical History  Diagnosis Date  . Asthma    Past Surgical History  Procedure Laterality Date  . Dnc     Family History  Problem Relation Age of Onset  . Cancer Mother     ovarian cancer    Social History  Substance Use Topics  . Smoking status: Current Every Day Smoker -- 0.25 packs/day    Types: Cigarettes  . Smokeless tobacco: Never Used  . Alcohol Use: 1.2 oz/week    2 Cans of beer per week     Comment: ocassional   OB History    No data available     Review of Systems  HENT: Positive for hearing loss (Difficulty hearing. ).   Eyes: Positive for visual  disturbance (Blurry vision. ).  Musculoskeletal: Positive for gait problem.  Neurological: Positive for dizziness and headaches (Frontal head pain). Negative for syncope, weakness, light-headedness and numbness.  All other systems reviewed and are negative.  Allergies  Aspirin  Home Medications   Prior to Admission medications   Medication Sig Start Date End Date Taking? Authorizing Provider  albuterol (PROVENTIL HFA;VENTOLIN HFA) 108 (90 BASE) MCG/ACT inhaler Inhale 2 puffs into the lungs every 4 (four) hours as needed for wheezing or shortness of breath. 09/17/15   Everlene FarrierWilliam Dansie, PA-C  azithromycin (ZITHROMAX) 250 MG tablet Take 1 tablet (250 mg total) by mouth daily. Take 1 every day starting Tuesday 10/13/15 until finished. You received your first dose today in the ED. 10/12/15   Catha GosselinHanna Patel-Mills, PA-C  benzonatate (TESSALON) 100 MG capsule Take 1 capsule (100 mg total) by mouth every 8 (eight) hours. 09/17/15   Everlene FarrierWilliam Dansie, PA-C  ibuprofen (ADVIL,MOTRIN) 800 MG tablet Take 800 mg by mouth every 8 (eight) hours as needed for headache, mild pain or moderate pain.     Historical Provider, MD  naproxen (NAPROSYN) 250 MG tablet Take 1 tablet (250 mg total) by mouth 2 (two) times daily with a meal. 09/17/15   Everlene FarrierWilliam Dansie, PA-C  predniSONE (STERAPRED UNI-PAK 21 TAB) 10 MG (21) TBPK tablet Take 1 tablet (10 mg total) by mouth daily. Take 6 tabs by  mouth daily  for 2 days, then 5 tabs for 2 days, then 4 tabs for 2 days, then 3 tabs for 2 days, 2 tabs for 2 days, then 1 tab by mouth daily for 2 days 10/12/15   Catha Gosselin, PA-C   Triage Vitals: BP 162/89 mmHg  Pulse 75  Temp(Src) 98 F (36.7 C) (Oral)  Resp 18  SpO2 97%   Physical Exam  Constitutional: She is oriented to person, place, and time. She appears well-developed and well-nourished. No distress.  HENT:  Head: Normocephalic.  Erythema to the right side of forehead  Eyes: Conjunctivae and EOM are normal.  Neck: Neck  supple. No tracheal deviation present.  Cardiovascular: Normal rate.   Pulmonary/Chest: Effort normal. No respiratory distress.  Musculoskeletal: Normal range of motion.  Neurological: She is alert and oriented to person, place, and time. No cranial nerve deficit.  Full ROM of all 4 extremities  Skin: Skin is warm and dry.  Psychiatric: She has a normal mood and affect. Her behavior is normal.  Nursing note and vitals reviewed.   ED Course  Procedures (including critical care time)  DIAGNOSTIC STUDIES: Oxygen Saturation is 97% on RA, normal by my interpretation.    COORDINATION OF CARE: 5:11 PM-Discussed treatment plan which includes CT Head with pt at bedside and pt agreed to plan.   Labs Review Labs Reviewed - No data to display  Imaging Review Ct Head Wo Contrast  10/13/2015  CLINICAL DATA:  Headache and blurred vision after striking head on the beam. EXAM: CT HEAD WITHOUT CONTRAST TECHNIQUE: Contiguous axial images were obtained from the base of the skull through the vertex without intravenous contrast. COMPARISON:  09/08/2014 FINDINGS: Right forehead scalp hematoma. The brainstem, cerebellum, cerebral peduncles, thalamus, basal ganglia, basilar cisterns, and ventricular system appear within normal limits. No intracranial hemorrhage, mass lesion, or acute CVA. IMPRESSION: 1. No acute intracranial findings. 2. Right forehead scalp hematoma. Electronically Signed   By: Gaylyn Rong M.D.   On: 10/13/2015 17:56   I have personally reviewed and evaluated these images as part of my medical decision-making.   EKG Interpretation None      MDM   Final diagnoses:  Contusion of forehead, initial encounter    Pt counseled on possible mild concussion.  I advised tylenol, ice   Follow up with primary care for recheck in 1 week if symptoms perrsist.  I personally performed the services in this documentation, which was scribed in my presence.  The recorded information has been  reviewed and considered.   Barnet Pall.     Lonia Skinner [place], PA-C 10/13/15 1853  Lavera Guise, MD 10/14/15 [id number]

## 2015-10-13 NOTE — ED Notes (Signed)
Patient ambulated without difficulty down the hallway and back. 

## 2015-10-13 NOTE — Discharge Instructions (Signed)
Contusion °A contusion is a deep bruise. Contusions are the result of a blunt injury to tissues and muscle fibers under the skin. The injury causes bleeding under the skin. The skin overlying the contusion may turn blue, purple, or yellow. Minor injuries will give you a painless contusion, but more severe contusions may stay painful and swollen for a few weeks.  °CAUSES  °This condition is usually caused by a blow, trauma, or direct force to an area of the body. °SYMPTOMS  °Symptoms of this condition include: °· Swelling of the injured area. °· Pain and tenderness in the injured area. °· Discoloration. The area may have redness and then turn blue, purple, or yellow. °DIAGNOSIS  °This condition is diagnosed based on a physical exam and medical history. An X-ray, CT scan, or MRI may be needed to determine if there are any associated injuries, such as broken bones (fractures). °TREATMENT  °Specific treatment for this condition depends on what area of the body was injured. In general, the best treatment for a contusion is resting, icing, applying pressure to (compression), and elevating the injured area. This is often called the RICE strategy. Over-the-counter anti-inflammatory medicines may also be recommended for pain control.  °HOME CARE INSTRUCTIONS  °· Rest the injured area. °· If directed, apply ice to the injured area: °¨ Put ice in a plastic bag. °¨ Place a towel between your skin and the bag. °¨ Leave the ice on for 20 minutes, 2-3 times per day. °· If directed, apply light compression to the injured area using an elastic bandage. Make sure the bandage is not wrapped too tightly. Remove and reapply the bandage as directed by your health care provider. °· If possible, raise (elevate) the injured area above the level of your heart while you are sitting or lying down. °· Take over-the-counter and prescription medicines only as told by your health care provider. °SEEK MEDICAL CARE IF: °· Your symptoms do not  improve after several days of treatment. °· Your symptoms get worse. °· You have difficulty moving the injured area. °SEEK IMMEDIATE MEDICAL CARE IF:  °· You have severe pain. °· You have numbness in a hand or foot. °· Your hand or foot turns pale or cold. °  °This information is not intended to replace advice given to you by your health care provider. Make sure you discuss any questions you have with your health care provider. °  °Document Released: 09/21/2005 Document Revised: 09/02/2015 Document Reviewed: 04/29/2015 °Elsevier Interactive Patient Education ©2016 [employer] ° °Facial or Scalp Contusion °A facial or scalp contusion is a deep bruise on the face or head. Injuries to the face and head generally cause a lot of swelling, especially around the eyes. Contusions are the result of an injury that caused bleeding under the skin. The contusion may turn blue, purple, or yellow. Minor injuries will give you a painless contusion, but more severe contusions may stay painful and swollen for a few weeks.  °CAUSES  °A facial or scalp contusion is caused by a blunt injury or trauma to the face or head area.  °SIGNS AND SYMPTOMS  °· Swelling of the injured area.   °· Discoloration of the injured area.   °· Tenderness, soreness, or pain in the injured area.   °DIAGNOSIS  °The diagnosis can be made by taking a medical history and doing a physical exam. An X-ray exam, CT scan, or MRI may be needed to determine if there are any associated injuries, such as broken bones (fractures). °  TREATMENT  °Often, the best treatment for a facial or scalp contusion is applying cold compresses to the injured area. Over-the-counter medicines may also be recommended for pain control.  °HOME CARE INSTRUCTIONS  °· Only take over-the-counter or prescription medicines as directed by your health care provider.   °· Apply ice to the injured area.   °¨ Put ice in a plastic bag.   °¨ Place a towel between your skin and the bag.   °¨ Leave the  ice on for 20 minutes, 2-3 times a day.   °SEEK MEDICAL CARE IF: °· You have bite problems.   °· You have pain with chewing.   °· You are concerned about facial defects. °SEEK IMMEDIATE MEDICAL CARE IF: °· You have severe pain or a headache that is not relieved by medicine.   °· You have unusual sleepiness, confusion, or personality changes.   °· You throw up (vomit).   °· You have a persistent nosebleed.   °· You have double vision or blurred vision.   °· You have fluid drainage from your nose or ear.   °· You have difficulty walking or using your arms or legs.   °MAKE SURE YOU:  °· Understand these instructions. °· Will watch your condition. °· Will get help right away if you are not doing well or get worse. °  °This information is not intended to replace advice given to you by your health care provider. Make sure you discuss any questions you have with your health care provider. °  °Document Released: 01/19/2005 Document Revised: 01/02/2015 Document Reviewed: 07/25/2013 °Elsevier Interactive Patient Education ©2016 Elsevier Inc. ° °

## 2015-10-13 NOTE — ED Notes (Signed)
Pt reports standing up too quickly and hitting forehead on 4x4. Mild swelling noted. Denies LOC. Pain relieved with ice. AO x4.

## 2015-11-17 ENCOUNTER — Emergency Department (HOSPITAL_COMMUNITY)
Admission: EM | Admit: 2015-11-17 | Discharge: 2015-11-17 | Disposition: A | Discharge status: Home / Self Care | Payer: No Typology Code available for payment source | Attending: Emergency Medicine | Admitting: Emergency Medicine

## 2015-11-17 ENCOUNTER — Encounter (HOSPITAL_COMMUNITY): Payer: Self-pay

## 2015-11-17 DIAGNOSIS — R197 Diarrhea, unspecified: Secondary | ICD-10-CM

## 2015-11-17 DIAGNOSIS — Z79899 Other long term (current) drug therapy: Secondary | ICD-10-CM | POA: Insufficient documentation

## 2015-11-17 DIAGNOSIS — F17210 Nicotine dependence, cigarettes, uncomplicated: Secondary | ICD-10-CM | POA: Insufficient documentation

## 2015-11-17 DIAGNOSIS — E6609 Other obesity due to excess calories: Secondary | ICD-10-CM | POA: Insufficient documentation

## 2015-11-17 DIAGNOSIS — R1084 Generalized abdominal pain: Secondary | ICD-10-CM | POA: Insufficient documentation

## 2015-11-17 DIAGNOSIS — J45909 Unspecified asthma, uncomplicated: Secondary | ICD-10-CM | POA: Insufficient documentation

## 2015-11-17 DIAGNOSIS — R112 Nausea with vomiting, unspecified: Secondary | ICD-10-CM | POA: Insufficient documentation

## 2015-11-17 LAB — URINALYSIS, ROUTINE W REFLEX MICROSCOPIC
Bilirubin Urine: NEGATIVE
GLUCOSE, UA: NEGATIVE mg/dL
Hgb urine dipstick: NEGATIVE
LEUKOCYTES UA: NEGATIVE
Nitrite: NEGATIVE
PH: 8.5 — AB (ref 5.0–8.0)
PROTEIN: 100 mg/dL — AB
Specific Gravity, Urine: 1.031 — ABNORMAL HIGH (ref 1.005–1.030)

## 2015-11-17 LAB — COMPREHENSIVE METABOLIC PANEL
ALBUMIN: 4.4 g/dL (ref 3.5–5.0)
ALT: 25 U/L (ref 14–54)
AST: 31 U/L (ref 15–41)
Alkaline Phosphatase: 75 U/L (ref 38–126)
Anion gap: 12 (ref 5–15)
BUN: 14 mg/dL (ref 6–20)
CHLORIDE: 101 mmol/L (ref 101–111)
CO2: 24 mmol/L (ref 22–32)
CREATININE: 0.9 mg/dL (ref 0.44–1.00)
Calcium: 9.1 mg/dL (ref 8.9–10.3)
GFR calc Af Amer: >60 mL/min (ref >60)
GLUCOSE: 99 mg/dL (ref 65–99)
POTASSIUM: 3.5 mmol/L (ref 3.5–5.1)
SODIUM: 137 mmol/L (ref 135–145)
Total Bilirubin: 0.9 mg/dL (ref 0.3–1.2)
Total Protein: 7.8 g/dL (ref 6.5–8.1)

## 2015-11-17 LAB — URINE MICROSCOPIC-ADD ON: Bacteria, UA: NONE SEEN

## 2015-11-17 LAB — CBC
HEMATOCRIT: 42.6 % (ref 36.0–46.0)
Hemoglobin: 14.9 g/dL (ref 12.0–15.0)
MCH: 32.5 pg (ref 26.0–34.0)
MCHC: 35 g/dL (ref 30.0–36.0)
MCV: 92.8 fL (ref 78.0–100.0)
PLATELETS: 201 10*3/uL (ref 150–400)
RBC: 4.59 MIL/uL (ref 3.87–5.11)
RDW: 12.1 % (ref 11.5–15.5)
WBC: 9.1 10*3/uL (ref 4.0–10.5)

## 2015-11-17 LAB — LIPASE, BLOOD: LIPASE: 32 U/L (ref 11–51)

## 2015-11-17 MED ORDER — DICYCLOMINE HCL 20 MG PO TABS: 20.0000 mg | ORAL_TABLET | Freq: Four times a day (QID) | ORAL | Status: DC | PRN

## 2015-11-17 MED ORDER — HYDROMORPHONE HCL 1 MG/ML IJ SOLN
1.0000 mg | Freq: Once | INTRAMUSCULAR | Status: AC
  Administered 2015-11-17: 1 mg via INTRAVENOUS
  Filled 2015-11-17: qty 1

## 2015-11-17 MED ORDER — ONDANSETRON HCL 4 MG/2ML IJ SOLN
4.0000 mg | Freq: Once | INTRAMUSCULAR | Status: AC | PRN
  Administered 2015-11-17: 4 mg via INTRAVENOUS
  Filled 2015-11-17: qty 2

## 2015-11-17 MED ORDER — HYDROMORPHONE HCL 1 MG/ML IJ SOLN
0.5000 mg | Freq: Once | INTRAMUSCULAR | Status: AC
  Administered 2015-11-17: 0.5 mg via INTRAVENOUS
  Filled 2015-11-17: qty 1

## 2015-11-17 MED ORDER — PROMETHAZINE HCL 25 MG/ML IJ SOLN
12.5000 mg | Freq: Once | INTRAMUSCULAR | Status: AC
  Administered 2015-11-17: 12.5 mg via INTRAVENOUS
  Filled 2015-11-17 (×2): qty 1

## 2015-11-17 MED ORDER — PROMETHAZINE HCL 25 MG PO TABS: 25.0000 mg | ORAL_TABLET | Freq: Four times a day (QID) | ORAL | Status: DC | PRN

## 2015-11-17 MED ORDER — PROMETHAZINE HCL 25 MG/ML IJ SOLN
12.5000 mg | Freq: Once | INTRAMUSCULAR | Status: AC
  Administered 2015-11-17: 12.5 mg via INTRAVENOUS
  Filled 2015-11-17: qty 1

## 2015-11-17 MED ORDER — SODIUM CHLORIDE 0.9 % IV BOLUS (SEPSIS)
1000.0000 mL | Freq: Once | INTRAVENOUS | Status: AC
  Administered 2015-11-17: 1000 mL via INTRAVENOUS

## 2015-11-17 NOTE — ED Notes (Signed)
Pt was given sprite for po/fluid challenge---- tolerating well; pt denies nausea at this time.

## 2015-11-17 NOTE — Discharge Instructions (Signed)

## 2015-11-17 NOTE — ED Provider Notes (Signed)
CSN: [id number]     Arrival date & time 11/17/15  1650 History   First MD Initiated Contact with Patient 11/17/15 1658     Chief Complaint  Patient presents with  . Nausea  . Emesis  . Diarrhea  . Abdominal Pain     (Consider location/radiation/quality/duration/timing/severity/associated sxs/prior Treatment) HPI   44 year old female with nausea, vomiting and diarrhea. Symptom onset about 4 days ago. Persistent since then. Diffuse crampy abdominal pain. No blood in stool or emesis. Subjective fever. Multiple contacts with similar symptoms recently. No urinary complaints. No unusual vaginal bleeding or discharge.  Past Medical History  Diagnosis Date  . Asthma    Past Surgical History  Procedure Laterality Date  . Dnc     Family History  Problem Relation Age of Onset  . Cancer Mother     ovarian cancer    Social History  Substance Use Topics  . Smoking status: Current Every Day Smoker -- 0.25 packs/day    Types: Cigarettes  . Smokeless tobacco: Never Used  . Alcohol Use: 1.2 oz/week    2 Cans of beer per week     Comment: ocassional   OB History    No data available     Review of Systems  All systems reviewed and negative, other than as noted in HPI.   Allergies  Aspirin  Home Medications   Prior to Admission medications   Medication Sig Start Date End Date Taking? Authorizing Provider  albuterol (PROVENTIL HFA;VENTOLIN HFA) 108 (90 BASE) MCG/ACT inhaler Inhale 2 puffs into the lungs every 4 (four) hours as needed for wheezing or shortness of breath. 09/17/15  Yes Everlene Farrier, PA-C  BUPROPION HCL PO Take by mouth daily.   Yes Historical Provider, MD  FLUoxetine (PROZAC) 20 MG capsule Take 20 mg by mouth daily.   Yes Historical Provider, MD  hydrOXYzine (VISTARIL) 25 MG capsule Take 25 mg by mouth 3 (three) times daily as needed for anxiety.   Yes Historical Provider, MD  ibuprofen (ADVIL,MOTRIN) 800 MG tablet Take 800 mg by mouth every 8 (eight) hours as  needed for headache, mild pain or moderate pain.    Yes Historical Provider, MD   BP 129/80 mmHg  Pulse 77  Temp(Src) 98.4 F (36.9 C) (Oral)  Resp 18  SpO2 96% Physical Exam  Constitutional: She appears well-developed and well-nourished. No distress.  Laying in bed. No acute distress. Obese.  HENT:  Head: Normocephalic and atraumatic.  Eyes: Conjunctivae are normal. Right eye exhibits no discharge. Left eye exhibits no discharge.  Neck: Neck supple.  Cardiovascular: Normal rate, regular rhythm and normal heart sounds.  Exam reveals no gallop and no friction rub.   No murmur heard. Pulmonary/Chest: Effort normal and breath sounds normal. No respiratory distress.  Abdominal: Soft. She exhibits no distension. There is no tenderness.  Musculoskeletal: She exhibits no edema or tenderness.  Neurological: She is alert.  Skin: Skin is warm and dry.  Psychiatric: She has a normal mood and affect. Her behavior is normal. Thought content normal.  Nursing note and vitals reviewed.   ED Course  Procedures (including critical care time) Labs Review Labs Reviewed  URINALYSIS, ROUTINE W REFLEX MICROSCOPIC (NOT AT [facility]) - Abnormal; Notable for the following:    Color, Urine AMBER (*)    APPearance TURBID (*)    Specific Gravity, Urine 1.031 (*)    pH 8.5 (*)    Ketones, ur >80 (*)    Protein, ur 100 (*)  All other components within normal limits  URINE MICROSCOPIC-ADD ON - Abnormal; Notable for the following:    Squamous Epithelial / LPF 0-5 (*)    All other components within normal limits  LIPASE, BLOOD  COMPREHENSIVE METABOLIC PANEL  CBC    Imaging Review No results found. I have personally reviewed and evaluated these images and lab results as part of my medical decision-making.   EKG Interpretation None      MDM   Final diagnoses:  Nausea vomiting and diarrhea    44 year old female with nausea, vomiting and diarrhea. Some abdominal pain, but her abdominal exam is  overall pretty benign. Ketones on urinalysis, otherwise workup fairly unremarkable. She does feel better after symptomatic with fluids, pain medicine and antiemetics.    Raeford RazorStephen Tangy Drozdowski, MD 12/03/15 1447

## 2015-11-17 NOTE — ED Notes (Signed)
Per EMS [facility] c/o generalized abd pain with n/v/d x4 days.

## 2015-11-17 NOTE — ED Notes (Signed)
Bed: WA19 Expected date:  Expected time:  Means of arrival:  Comments: Ems 

## 2015-12-22 ENCOUNTER — Encounter (HOSPITAL_COMMUNITY): Payer: Self-pay | Admitting: Emergency Medicine

## 2015-12-22 ENCOUNTER — Emergency Department (HOSPITAL_COMMUNITY)
Admission: EM | Admit: 2015-12-22 | Discharge: 2015-12-22 | Disposition: A | Discharge status: Home / Self Care | Payer: No Typology Code available for payment source | Attending: Emergency Medicine | Admitting: Emergency Medicine

## 2015-12-22 ENCOUNTER — Emergency Department (HOSPITAL_COMMUNITY): Payer: No Typology Code available for payment source

## 2015-12-22 DIAGNOSIS — F17210 Nicotine dependence, cigarettes, uncomplicated: Secondary | ICD-10-CM | POA: Insufficient documentation

## 2015-12-22 DIAGNOSIS — Z79899 Other long term (current) drug therapy: Secondary | ICD-10-CM | POA: Insufficient documentation

## 2015-12-22 DIAGNOSIS — J45909 Unspecified asthma, uncomplicated: Secondary | ICD-10-CM | POA: Insufficient documentation

## 2015-12-22 DIAGNOSIS — J40 Bronchitis, not specified as acute or chronic: Secondary | ICD-10-CM

## 2015-12-22 LAB — CBC WITH DIFFERENTIAL/PLATELET
Basophils Absolute: 0 10*3/uL (ref 0.0–0.1)
Basophils Relative: 0 %
EOS ABS: 0.2 10*3/uL (ref 0.0–0.7)
EOS PCT: 3 %
HCT: 42.6 % (ref 36.0–46.0)
HEMOGLOBIN: 14 g/dL (ref 12.0–15.0)
LYMPHS ABS: 1.1 10*3/uL (ref 0.7–4.0)
Lymphocytes Relative: 16 %
MCH: 31.7 pg (ref 26.0–34.0)
MCHC: 32.9 g/dL (ref 30.0–36.0)
MCV: 96.4 fL (ref 78.0–100.0)
MONOS PCT: 9 %
Monocytes Absolute: 0.6 10*3/uL (ref 0.1–1.0)
NEUTROS PCT: 72 %
Neutro Abs: 5 10*3/uL (ref 1.7–7.7)
Platelets: 213 10*3/uL (ref 150–400)
RBC: 4.42 MIL/uL (ref 3.87–5.11)
RDW: 12.5 % (ref 11.5–15.5)
WBC: 6.9 10*3/uL (ref 4.0–10.5)

## 2015-12-22 LAB — URINALYSIS, ROUTINE W REFLEX MICROSCOPIC
BILIRUBIN URINE: NEGATIVE
Glucose, UA: NEGATIVE mg/dL
HGB URINE DIPSTICK: NEGATIVE
Ketones, ur: NEGATIVE mg/dL
Leukocytes, UA: NEGATIVE
Nitrite: NEGATIVE
PH: 6 (ref 5.0–8.0)
Protein, ur: NEGATIVE mg/dL
SPECIFIC GRAVITY, URINE: 1.013 (ref 1.005–1.030)

## 2015-12-22 LAB — COMPREHENSIVE METABOLIC PANEL
ALT: 46 U/L (ref 14–54)
AST: 30 U/L (ref 15–41)
Albumin: 4 g/dL (ref 3.5–5.0)
Alkaline Phosphatase: 70 U/L (ref 38–126)
Anion gap: 8 (ref 5–15)
BUN: 17 mg/dL (ref 6–20)
CHLORIDE: 105 mmol/L (ref 101–111)
CO2: 26 mmol/L (ref 22–32)
CREATININE: 0.72 mg/dL (ref 0.44–1.00)
Calcium: 9.4 mg/dL (ref 8.9–10.3)
GFR calc Af Amer: >60 mL/min (ref >60)
GFR calc non Af Amer: >60 mL/min (ref >60)
GLUCOSE: 102 mg/dL — AB (ref 65–99)
Potassium: 4.1 mmol/L (ref 3.5–5.1)
SODIUM: 139 mmol/L (ref 135–145)
Total Bilirubin: 0.8 mg/dL (ref 0.3–1.2)
Total Protein: 7 g/dL (ref 6.5–8.1)

## 2015-12-22 LAB — LIPASE, BLOOD: LIPASE: 37 U/L (ref 11–51)

## 2015-12-22 MED ORDER — AMOXICILLIN 500 MG PO CAPS: 500.0000 mg | ORAL_CAPSULE | Freq: Three times a day (TID) | ORAL | Status: DC

## 2015-12-22 MED ORDER — ONDANSETRON HCL 4 MG/2ML IJ SOLN
4.0000 mg | Freq: Once | INTRAMUSCULAR | Status: AC
  Administered 2015-12-22: 4 mg via INTRAVENOUS
  Filled 2015-12-22: qty 2

## 2015-12-22 MED ORDER — ONDANSETRON HCL 4 MG PO TABS: 4.0000 mg | ORAL_TABLET | Freq: Four times a day (QID) | ORAL | Status: DC

## 2015-12-22 MED ORDER — BENZONATATE 100 MG PO CAPS: 100.0000 mg | ORAL_CAPSULE | Freq: Three times a day (TID) | ORAL | Status: DC

## 2015-12-22 MED ORDER — SODIUM CHLORIDE 0.9 % IV BOLUS (SEPSIS)
1000.0000 mL | Freq: Once | INTRAVENOUS | Status: AC
  Administered 2015-12-22: 1000 mL via INTRAVENOUS

## 2015-12-22 NOTE — ED Notes (Signed)
Awake. Verbally responsive. A/O x4. Resp even and unlabored. No audible adventitious breath sounds noted. ABC's intact. IV infused NS at 95099ml/hr without difficulty. Family at bedside.

## 2015-12-22 NOTE — ED Notes (Signed)
Per EMS: Pt from [address]  Gen body pain, NV x 2 days.

## 2015-12-22 NOTE — Discharge Instructions (Signed)
Tylenol Motrin for fevers and aches drink plenty of fluids and follow-up if not improving

## 2015-12-22 NOTE — ED Notes (Signed)
Awake. Verbally responsive. A/O x4. Resp even and unlabored. No audible adventitious breath sounds noted. ABC's intact. IV saline lock patent and intact. Family at bedside. 

## 2015-12-22 NOTE — ED Notes (Signed)
Patient transported to X-ray 

## 2015-12-22 NOTE — ED Notes (Signed)
Pt reported productive congested cough of greenish sputum, n/v/d, and generalized muscle aches.

## 2015-12-22 NOTE — ED Notes (Signed)
Awake. Verbally responsive. A/O x4. Resp even and unlabored. No audible adventitious breath sounds noted. ABC's intact.  

## 2015-12-22 NOTE — ED Provider Notes (Signed)
CSN: [id number]     Arrival date & time 12/22/15  1005 History   First MD Initiated Contact with Patient 12/22/15 1026     Chief Complaint  Patient presents with  . Nausea  . Emesis     (Consider location/radiation/quality/duration/timing/severity/associated sxs/prior Treatment) Patient is a 44 y.o. female presenting with vomiting. The history is provided by the patient (Patient complains of cough and some vomiting).  Emesis Severity:  Moderate Timing:  Intermittent Quality:  Malodorous material Able to tolerate:  Liquids Progression:  Worsening Chronicity:  New Associated symptoms: no abdominal pain, no diarrhea and no headaches     Past Medical History  Diagnosis Date  . Asthma    Past Surgical History  Procedure Laterality Date  . Dnc     Family History  Problem Relation Age of Onset  . Cancer Mother     ovarian cancer    Social History  Substance Use Topics  . Smoking status: Current Every Day Smoker -- 0.25 packs/day    Types: Cigarettes  . Smokeless tobacco: Never Used  . Alcohol Use: 1.2 oz/week    2 Cans of beer per week     Comment: ocassional   OB History    No data available     Review of Systems  Constitutional: Negative for appetite change and fatigue.  HENT: Negative for congestion, ear discharge and sinus pressure.   Eyes: Negative for discharge.  Respiratory: Positive for cough.   Cardiovascular: Negative for chest pain.  Gastrointestinal: Positive for vomiting. Negative for abdominal pain and diarrhea.  Genitourinary: Negative for frequency and hematuria.  Musculoskeletal: Negative for back pain.  Skin: Negative for rash.  Neurological: Negative for seizures and headaches.  Psychiatric/Behavioral: Negative for hallucinations.      Allergies  Aspirin  Home Medications   Prior to Admission medications   Medication Sig Start Date End Date Taking? Authorizing Provider  albuterol (PROVENTIL HFA;VENTOLIN HFA) 108 (90 BASE) MCG/ACT  inhaler Inhale 2 puffs into the lungs every 4 (four) hours as needed for wheezing or shortness of breath. 09/17/15  Yes Everlene Farrier, PA-C  diphenhydrAMINE (BENADRYL) 25 MG tablet Take 50 mg by mouth every 6 (six) hours as needed for allergies.   Yes Historical Provider, MD  FLUoxetine (PROZAC) 20 MG capsule Take 20 mg by mouth daily.   Yes Historical Provider, MD  hydrOXYzine (VISTARIL) 25 MG capsule Take 25 mg by mouth 3 (three) times daily as needed for anxiety.   Yes Historical Provider, MD  ibuprofen (ADVIL,MOTRIN) 800 MG tablet Take 800 mg by mouth every 8 (eight) hours as needed for headache, mild pain or moderate pain.    Yes Historical Provider, MD  promethazine (PHENERGAN) 25 MG tablet Take 1 tablet (25 mg total) by mouth every 6 (six) hours as needed for nausea or vomiting. 11/17/15  Yes Raeford Razor, MD  amoxicillin (AMOXIL) 500 MG capsule Take 1 capsule (500 mg total) by mouth 3 (three) times daily. 12/22/15   Bethann Berkshire, MD  benzonatate (TESSALON) 100 MG capsule Take 1 capsule (100 mg total) by mouth every 8 (eight) hours. 12/22/15   Bethann Berkshire, MD  dicyclomine (BENTYL) 20 MG tablet Take 1 tablet (20 mg total) by mouth every 6 (six) hours as needed for spasms. 11/17/15   Raeford Razor, MD  ondansetron (ZOFRAN) 4 MG tablet Take 1 tablet (4 mg total) by mouth every 6 (six) hours. 12/22/15   Bethann Berkshire, MD   BP 110/49 mmHg  Pulse 75  Temp(Src)  98.4 F (36.9 C) (Oral)  Resp 16  Ht 5\' 4"  (1.626 m)  Wt 270 lb (122.471 kg)  BMI 46.32 kg/m2  SpO2 96% Physical Exam  Constitutional: She is oriented to person, place, and time. She appears well-developed.  HENT:  Head: Normocephalic.  Eyes: Conjunctivae and EOM are normal. No scleral icterus.  Neck: Neck supple. No thyromegaly present.  Cardiovascular: Normal rate and regular rhythm.  Exam reveals no gallop and no friction rub.   No murmur heard. Pulmonary/Chest: No stridor. She has no wheezes. She has no rales. She exhibits  no tenderness.  Abdominal: She exhibits no distension. There is no tenderness. There is no rebound.  Musculoskeletal: Normal range of motion. She exhibits no edema.  Lymphadenopathy:    She has no cervical adenopathy.  Neurological: She is oriented to person, place, and time. She exhibits normal muscle tone. Coordination normal.  Skin: No rash noted. No erythema.  Psychiatric: She has a normal mood and affect. Her behavior is normal.    ED Course  Procedures (including critical care time) Labs Review Labs Reviewed  COMPREHENSIVE METABOLIC PANEL - Abnormal; Notable for the following:    Glucose, Bld 102 (*)    All other components within normal limits  URINALYSIS, ROUTINE W REFLEX MICROSCOPIC (NOT AT [facility]) - Abnormal; Notable for the following:    APPearance CLOUDY (*)    All other components within normal limits  LIPASE, BLOOD  CBC WITH DIFFERENTIAL/PLATELET    Imaging Review Dg Chest 2 View  12/22/2015  CLINICAL DATA:  Productive cough, nausea, vomiting, diarrhea, and generalized muscle aches for 1 week. Smoker. EXAM: CHEST  2 VIEW COMPARISON:  09/17/2015 FINDINGS: The cardiomediastinal silhouette is within normal limits. Mild, chronic bronchitic changes are stable to slightly less prominent than on the prior study. No confluent airspace opacity, edema, pleural effusion, or pneumothorax is identified. No acute osseous abnormality is seen. IMPRESSION: Mild chronic bronchitic changes without acute abnormality identified. Electronically Signed   By: Sebastian AcheAllen  Grady M.D.   On: 12/22/2015 11:32   I have personally reviewed and evaluated these images and lab results as part of my medical decision-making.   EKG Interpretation None      MDM   Final diagnoses:  Bronchitis    Patient improved with fluids and nausea medicine. Suspect bronchitis. Will treat with amoxicillin Tessalon Perles and Zofran for nausea    Bethann BerkshireJoseph Niam Nepomuceno, MD 12/22/15 1345

## 2015-12-29 MED FILL — AMOXICILLIN 500 MG CAPSULE: 500 | 7 days supply | Qty: 21 | Fill #0

## 2015-12-29 MED FILL — BENZONATATE 100 MG CAPSULE: 100 | 7 days supply | Qty: 21 | Fill #0

## 2016-02-24 ENCOUNTER — Ambulatory Visit: Payer: No Typology Code available for payment source | Admitting: Internal Medicine

## 2016-03-08 ENCOUNTER — Encounter: Payer: Self-pay | Admitting: Family Medicine

## 2016-03-08 ENCOUNTER — Ambulatory Visit: Payer: No Typology Code available for payment source | Attending: Family Medicine | Admitting: Family Medicine

## 2016-03-08 VITALS — BP 134/86 | HR 79 | Temp 97.9°F | Resp 16 | Ht 65.0 in | Wt 270.0 lb

## 2016-03-08 DIAGNOSIS — Z7982 Long term (current) use of aspirin: Secondary | ICD-10-CM | POA: Insufficient documentation

## 2016-03-08 DIAGNOSIS — N951 Menopausal and female climacteric states: Secondary | ICD-10-CM

## 2016-03-08 DIAGNOSIS — Z975 Presence of (intrauterine) contraceptive device: Secondary | ICD-10-CM

## 2016-03-08 DIAGNOSIS — R232 Flushing: Secondary | ICD-10-CM | POA: Insufficient documentation

## 2016-03-08 DIAGNOSIS — I209 Angina pectoris, unspecified: Secondary | ICD-10-CM

## 2016-03-08 DIAGNOSIS — J45909 Unspecified asthma, uncomplicated: Secondary | ICD-10-CM | POA: Insufficient documentation

## 2016-03-08 DIAGNOSIS — F17200 Nicotine dependence, unspecified, uncomplicated: Secondary | ICD-10-CM

## 2016-03-08 DIAGNOSIS — J4520 Mild intermittent asthma, uncomplicated: Secondary | ICD-10-CM

## 2016-03-08 DIAGNOSIS — F17210 Nicotine dependence, cigarettes, uncomplicated: Secondary | ICD-10-CM | POA: Insufficient documentation

## 2016-03-08 DIAGNOSIS — Z0000 Encounter for general adult medical examination without abnormal findings: Secondary | ICD-10-CM

## 2016-03-08 DIAGNOSIS — Z720 Tobacco use: Secondary | ICD-10-CM

## 2016-03-08 DIAGNOSIS — R079 Chest pain, unspecified: Secondary | ICD-10-CM | POA: Insufficient documentation

## 2016-03-08 DIAGNOSIS — Z79899 Other long term (current) drug therapy: Secondary | ICD-10-CM | POA: Insufficient documentation

## 2016-03-08 DIAGNOSIS — G629 Polyneuropathy, unspecified: Secondary | ICD-10-CM | POA: Insufficient documentation

## 2016-03-08 LAB — LIPID PANEL
CHOL/HDL RATIO: 3.7 ratio (ref <=5.0)
CHOLESTEROL: 150 mg/dL (ref 125–200)
HDL: 41 mg/dL — AB (ref >=46)
LDL Cholesterol: 91 mg/dL (ref <130)
TRIGLYCERIDES: 90 mg/dL (ref <150)
VLDL: 18 mg/dL (ref <30)

## 2016-03-08 LAB — POCT GLYCOSYLATED HEMOGLOBIN (HGB A1C): Hemoglobin A1C: 5.1

## 2016-03-08 MED ORDER — BECLOMETHASONE DIPROPIONATE 40 MCG/ACT IN AERS: 2.0000 | INHALATION_SPRAY | Freq: Two times a day (BID) | RESPIRATORY_TRACT | Status: DC

## 2016-03-08 MED ORDER — NITROGLYCERIN 0.4 MG SL SUBL: 0.4000 mg | SUBLINGUAL_TABLET | SUBLINGUAL | Status: AC | PRN

## 2016-03-08 MED ORDER — VARENICLINE TARTRATE 0.5 MG X 11 & 1 MG X 42 PO MISC: ORAL | Status: AC

## 2016-03-08 MED ORDER — GABAPENTIN 300 MG PO CAPS: 300.0000 mg | ORAL_CAPSULE | Freq: Two times a day (BID) | ORAL | Status: AC

## 2016-03-08 MED ORDER — VARENICLINE TARTRATE 1 MG PO TABS: 1.0000 mg | ORAL_TABLET | Freq: Two times a day (BID) | ORAL | Status: AC

## 2016-03-08 MED ORDER — ASPIRIN EC 81 MG PO TBEC: 81.0000 mg | DELAYED_RELEASE_TABLET | Freq: Every day | ORAL | Status: DC

## 2016-03-08 MED ORDER — CETIRIZINE HCL 10 MG PO TABS: 10.0000 mg | ORAL_TABLET | Freq: Every day | ORAL | Status: DC

## 2016-03-08 MED FILL — OMEPRAZOLE DR 20 MG CAPSULE: 20 | 30 days supply | Qty: 60 | Fill #1

## 2016-03-08 MED FILL — GABAPENTIN 300 MG CAPSULE: 300 | 30 days supply | Qty: 60 | Fill #0

## 2016-03-08 MED FILL — !VENTOLIN HFA INHALER: 108 (90 BAS | 17 days supply | Qty: 18 | Fill #0

## 2016-03-08 MED FILL — !QVAR 40 MCG ORAL INHALER: 40 MCG | 30 days supply | Qty: 1 | Fill #0

## 2016-03-08 MED FILL — NITROSTAT 0.4 MG TABLET SL: 0.4 | 30 days supply | Qty: 25 | Fill #0

## 2016-03-08 NOTE — Assessment & Plan Note (Signed)
A; chronic asthma P; Refilled albuterol, qvar, zyrtec

## 2016-03-08 NOTE — Progress Notes (Signed)
Medication refills  Chest pain x 1 year SHOB  Pain scale # 5 No tobacco user 3 cigarette per day  Suicide thoughts in the past two weeks

## 2016-03-08 NOTE — Assessment & Plan Note (Signed)
A: smoker P; Cessation addressed

## 2016-03-08 NOTE — Patient Instructions (Addendum)
Emily GhaziJeannine was seen today for chest pain.  Diagnoses and all orders for this visit:  Healthcare maintenance -     POCT glycosylated hemoglobin (Hb A1C)  Asthma, chronic, mild intermittent, uncomplicated -     beclomethasone (QVAR) 40 MCG/ACT inhaler; Inhale 2 puffs into the lungs 2 (two) times daily. -     cetirizine (ZYRTEC) 10 MG tablet; Take 1 tablet (10 mg total) by mouth daily.  Ischemic chest pain (HCC) -     aspirin EC 81 MG tablet; Take 1 tablet (81 mg total) by mouth daily. -     nitroGLYCERIN (NITROSTAT) 0.4 MG SL tablet; Place 1 tablet (0.4 mg total) under the tongue every 5 (five) minutes as needed for chest pain. -     EKG 12-Lead; Standing -     Lipid Panel -     CBC -     Ambulatory referral to Cardiology -     DG Chest 2 View; Future -     EKG 12-Lead  Norplant in place  Neuropathy (HCC) -     gabapentin (NEURONTIN) 300 MG capsule; Take 1 capsule (300 mg total) by mouth 2 (two) times daily.  Smoking -     varenicline (CHANTIX STARTING MONTH PAK) 0.5 MG X 11 & 1 MG X 42 tablet; Taper per packet insert -     varenicline (CHANTIX CONTINUING MONTH PAK) 1 MG tablet; Take 1 tablet (1 mg total) by mouth 2 (two) times daily.   Smoking cessation support: smoking cessation hotline: 1-800-QUIT-NOW.  Smoking cessation classes are available through [facility]. Call [phone] or visit our website at [email or web address].  F/u in 3-4 weeks for norplant removal, 30 minute procedure visit  Dr. Armen PickupFunches

## 2016-03-08 NOTE — Progress Notes (Signed)
Subjective:  Patient ID: Emily Whitehead, female    DOB: 11/23/1971  Age: 45 y.o. MRN: [id number]  CC: Chest Pain   HPI Emily Whitehead presents for    1. Chest pain: for past year. Comes and goes. Associated with shortness of breath and extreme fatigue. Worse with exertion. Improved with rest but not completely relieved. Pain is pressure. L chest. Does not radiate. Has been associated with nausea and sweating. No emesis. Smoker for past 25 years, 1/4 PPD. PGF died of heart attack at age 43.   She has hx of asthma, request, out of inhalers.  2. Asthma; most symptomatic in spring time. She uses albuterol, qvar and antihistamine in spring. She request all 3. claritin has not worked well for her in the past.  3. Hot flashes: she has hot flashes and irregular periods. She has had norplant in L arm for past 15 years. She does not desire pregnancy.   4. Neuropathy; in hands and arms. She use to weight 450 #. She has tingling and numbness in both hands at times. She used to take gabapentin. She would like to take it again.   Social History  Substance Use Topics  . Smoking status: Current Every Day Smoker -- 0.25 packs/day    Types: Cigarettes  . Smokeless tobacco: Never Used  . Alcohol Use: 1.2 oz/week    2 Cans of beer per week     Comment: ocassional    Outpatient Prescriptions Prior to Visit  Medication Sig Dispense Refill  . albuterol (PROVENTIL HFA;VENTOLIN HFA) 108 (90 BASE) MCG/ACT inhaler Inhale 2 puffs into the lungs every 4 (four) hours as needed for wheezing or shortness of breath. 1 Inhaler 1  . diphenhydrAMINE (BENADRYL) 25 MG tablet Take 50 mg by mouth every 6 (six) hours as needed for allergies.    Marland Kitchen. FLUoxetine (PROZAC) 20 MG capsule Take 20 mg by mouth daily.    . hydrOXYzine (VISTARIL) 25 MG capsule Take 25 mg by mouth 3 (three) times daily as needed for anxiety.    Marland Kitchen. ibuprofen (ADVIL,MOTRIN) 800 MG tablet Take 800 mg by mouth every 8 (eight) hours as needed for  headache, mild pain or moderate pain.     Marland Kitchen. dicyclomine (BENTYL) 20 MG tablet Take 1 tablet (20 mg total) by mouth every 6 (six) hours as needed for spasms. (Patient not taking: Reported on 03/08/2016) 12 tablet 0  . amoxicillin (AMOXIL) 500 MG capsule Take 1 capsule (500 mg total) by mouth 3 (three) times daily. 21 capsule 0  . benzonatate (TESSALON) 100 MG capsule Take 1 capsule (100 mg total) by mouth every 8 (eight) hours. 21 capsule 0  . ondansetron (ZOFRAN) 4 MG tablet Take 1 tablet (4 mg total) by mouth every 6 (six) hours. 12 tablet 0  . promethazine (PHENERGAN) 25 MG tablet Take 1 tablet (25 mg total) by mouth every 6 (six) hours as needed for nausea or vomiting. 12 tablet 0   No facility-administered medications prior to visit.    ROS Review of Systems  Constitutional: Negative for fever and chills.  Eyes: Negative for visual disturbance.  Respiratory: Positive for shortness of breath.   Cardiovascular: Positive for chest pain.  Gastrointestinal: Negative for abdominal pain and blood in stool.  Musculoskeletal: Negative for back pain and arthralgias.  Skin: Negative for rash.  Allergic/Immunologic: Negative for immunocompromised state.  Hematological: Negative for adenopathy. Does not bruise/bleed easily.  Psychiatric/Behavioral: Negative for suicidal ideas and dysphoric mood.    Objective:  BP 134/86 mmHg  Pulse 79  Temp(Src) 97.9 F (36.6 C) (Oral)  Resp 16  Ht  (1.651 m)  Wt 270 lb (122.471 kg)  BMI 44.93 kg/m2  SpO2 95%  BP/Weight 03/08/2016 12/22/2015 11/17/2015  Systolic BP 134 103 115  Diastolic BP 86 56 89  Wt. (Lbs) 270 270 -  BMI 44.93 46.32 -    Physical Exam  Constitutional: She is oriented to person, place, and time. She appears well-developed and well-nourished. No distress.  HENT:  Head: Normocephalic and atraumatic.  Cardiovascular: Normal rate, regular rhythm, normal heart sounds and intact distal pulses.  Exam reveals no gallop and no  friction rub.   No murmur heard. Pulmonary/Chest: Effort normal and breath sounds normal.  Abdominal: Soft. Bowel sounds are normal. She exhibits no distension and no mass. There is no tenderness. There is no rebound and no guarding.  Musculoskeletal: She exhibits no edema.  Neurological: She is alert and oriented to person, place, and time.  Skin: Skin is warm and dry. No rash noted.  Psychiatric: She has a normal mood and affect.   EKG: normal EKG, normal sinus rhythm.  Lab Results  Component Value Date   HGBA1C 5.1 03/08/2016   Assessment & Plan:   Emily Whitehead was seen today for chest pain.  Diagnoses and all orders for this visit:  Healthcare maintenance -     POCT glycosylated hemoglobin (Hb A1C)  Asthma, chronic, mild intermittent, uncomplicated -     beclomethasone (QVAR) 40 MCG/ACT inhaler; Inhale 2 puffs into the lungs 2 (two) times daily. -     cetirizine (ZYRTEC) 10 MG tablet; Take 1 tablet (10 mg total) by mouth daily.  Ischemic chest pain (HCC) -     aspirin EC 81 MG tablet; Take 1 tablet (81 mg total) by mouth daily. -     nitroGLYCERIN (NITROSTAT) 0.4 MG SL tablet; Place 1 tablet (0.4 mg total) under the tongue every 5 (five) minutes as needed for chest pain. -     EKG 12-Lead; Standing -     Lipid Panel -     Cancel: CBC -     Ambulatory referral to Cardiology -     DG Chest 2 View; Future -     EKG 12-Lead  Norplant in place  Neuropathy (HCC) -     gabapentin (NEURONTIN) 300 MG capsule; Take 1 capsule (300 mg total) by mouth 2 (two) times daily.  Smoking -     varenicline (CHANTIX STARTING MONTH PAK) 0.5 MG X 11 & 1 MG X 42 tablet; Taper per packet insert -     varenicline (CHANTIX CONTINUING MONTH PAK) 1 MG tablet; Take 1 tablet (1 mg total) by mouth 2 (two) times daily.  Hot flashes -     FSH/LH -     Estrogens, Total  Other orders -     EKG 12-Lead   No orders of the defined types were placed in this encounter.    Follow-up: No Follow-up on  file.   Dessa Phi MD

## 2016-03-08 NOTE — Assessment & Plan Note (Signed)
A: chest pain x one year. Sounds like angina. Normal EKG P: Smoking cessation with chantix, cautioned regarding SI CXR Lipids Daily ASA and NTG prn Cards referral

## 2016-03-09 LAB — FSH/LH
FSH: 56 m[IU]/mL
LH: 28.6 m[IU]/mL

## 2016-03-11 LAB — ESTROGENS, TOTAL: ESTROGEN: 103.9 pg/mL

## 2016-03-15 ENCOUNTER — Telehealth: Payer: Self-pay | Admitting: Family Medicine

## 2016-03-15 NOTE — Telephone Encounter (Signed)
Noted  

## 2016-03-15 NOTE — Telephone Encounter (Signed)
New Message ° °This message is to inform you that we have made 3 consecutive attempts to contact the patient. We have also mailed a letter to the patient to inform them to call in and schedule. Although we were unsuccessful in these attempts we wanted you to be aware of our efforts. Will remove the patient from our referral work queue at this time.  ° ° °Shanti °[facility] °

## 2016-03-16 ENCOUNTER — Telehealth: Payer: Self-pay | Admitting: *Deleted

## 2016-03-16 NOTE — Telephone Encounter (Signed)
LVM to return call.

## 2016-03-16 NOTE — Telephone Encounter (Signed)
-----   Message from Dessa PhiJosalyn Funches, MD sent at 03/13/2016 10:28 AM EDT ----- Post menopausal range FSH Normal estrogen and LH levels

## 2016-06-21 ENCOUNTER — Ambulatory Visit: Payer: No Typology Code available for payment source | Admitting: Family Medicine

## 2016-08-01 ENCOUNTER — Encounter: Payer: Self-pay | Admitting: Family Medicine

## 2016-08-01 ENCOUNTER — Ambulatory Visit: Payer: No Typology Code available for payment source | Attending: Family Medicine | Admitting: Family Medicine

## 2016-08-01 ENCOUNTER — Other Ambulatory Visit: Payer: Self-pay

## 2016-08-01 VITALS — BP 92/62 | HR 86 | Temp 98.4°F | Ht 65.0 in | Wt 297.2 lb

## 2016-08-01 DIAGNOSIS — I209 Angina pectoris, unspecified: Secondary | ICD-10-CM

## 2016-08-01 DIAGNOSIS — J45909 Unspecified asthma, uncomplicated: Secondary | ICD-10-CM | POA: Insufficient documentation

## 2016-08-01 DIAGNOSIS — M791 Myalgia, unspecified site: Secondary | ICD-10-CM

## 2016-08-01 DIAGNOSIS — R079 Chest pain, unspecified: Secondary | ICD-10-CM

## 2016-08-01 DIAGNOSIS — J4520 Mild intermittent asthma, uncomplicated: Secondary | ICD-10-CM

## 2016-08-01 DIAGNOSIS — K529 Noninfective gastroenteritis and colitis, unspecified: Secondary | ICD-10-CM

## 2016-08-01 DIAGNOSIS — K219 Gastro-esophageal reflux disease without esophagitis: Secondary | ICD-10-CM

## 2016-08-01 MED ORDER — CETIRIZINE HCL 10 MG PO TABS: 10.0000 mg | ORAL_TABLET | Freq: Every day | ORAL | 11 refills | Status: DC

## 2016-08-01 MED ORDER — ALBUTEROL SULFATE HFA 108 (90 BASE) MCG/ACT IN AERS: 2.0000 | INHALATION_SPRAY | RESPIRATORY_TRACT | 3 refills | Status: DC | PRN

## 2016-08-01 MED ORDER — ASPIRIN EC 81 MG PO TBEC: 81.0000 mg | DELAYED_RELEASE_TABLET | Freq: Every day | ORAL | 5 refills | Status: DC

## 2016-08-01 MED ORDER — IBUPROFEN 800 MG PO TABS: 800.0000 mg | ORAL_TABLET | Freq: Three times a day (TID) | ORAL | 3 refills | Status: AC | PRN

## 2016-08-01 MED ORDER — CETIRIZINE HCL 10 MG PO TABS: 10.0000 mg | ORAL_TABLET | Freq: Every day | ORAL | 11 refills | Status: AC

## 2016-08-01 MED ORDER — BECLOMETHASONE DIPROPIONATE 40 MCG/ACT IN AERS: 2.0000 | INHALATION_SPRAY | Freq: Two times a day (BID) | RESPIRATORY_TRACT | 12 refills | Status: DC

## 2016-08-01 MED ORDER — BECLOMETHASONE DIPROPIONATE 40 MCG/ACT IN AERS: 2.0000 | INHALATION_SPRAY | Freq: Two times a day (BID) | RESPIRATORY_TRACT | 12 refills | Status: AC

## 2016-08-01 MED ORDER — OMEPRAZOLE 20 MG PO CPDR: 20.0000 mg | DELAYED_RELEASE_CAPSULE | Freq: Two times a day (BID) | ORAL | 5 refills | Status: AC

## 2016-08-01 MED ORDER — ALBUTEROL SULFATE HFA 108 (90 BASE) MCG/ACT IN AERS: 2.0000 | INHALATION_SPRAY | RESPIRATORY_TRACT | 5 refills | Status: DC | PRN

## 2016-08-01 MED FILL — IBUPROFEN 800 MG TABLET: 800 | 10 days supply | Qty: 30 | Fill #0

## 2016-08-01 MED FILL — VENTOLIN HFA 90 MCG INHALER: 108 (90 BAS | 20 days supply | Qty: 18 | Fill #0

## 2016-08-01 MED FILL — ?CETIRIZINE HCL 10 MG TABLE: 10 | 30 days supply | Qty: 30 | Fill #0

## 2016-08-01 MED FILL — QVAR 40 MCG ORAL INHALER: 40 | 30 days supply | Qty: 9 | Fill #0

## 2016-08-01 MED FILL — ?OMEPRAZOLE DR 20 MG CAPSUL: 20 | 30 days supply | Qty: 60 | Fill #0

## 2016-08-01 NOTE — Progress Notes (Signed)
Subjective:  Patient ID: Emily ChiquitoJeannine Knee, female    DOB: 10/15/1971  Age: 45 y.o. MRN: [id number]  CC: Medication Refill   HPI Emily Whitehead  Has hx of asthma current smoker who presents for   1. 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 She developed respiratory symptoms with productive cough and muscle aches. She reports being treated with albenza. She reports being retreated last year with Albenza 200 mg for 14 days she did not get a chance to repeat the course two weeks later. For the past 5 months she is  having muscle aches in shoulders, R foot pain, abdominal cramping. She reports feeling worms move under her skin. She has seen one come out of her skin. She reports coughing up larvae and pasing worms in her stool. She denies bleeding in stool and weight loss.   2. Asthma: she continues to smoke. She reports cough, SOB and chest pains. Her cough is productive with black speckles and "larvae".   3. Chest pain: occurs 1-2 times per week. Relieved with nitroglycerin. L sided chest pressure. Does not radiate. Has been presents for past 5 months.    Social History  Substance Use Topics  . Smoking status: Current Every Day Smoker    Packs/day: 0.25    Types: Cigarettes  . Smokeless tobacco: Never Used  . Alcohol use 1.2 oz/week    2 Cans of beer per week     Comment: ocassional   Outpatient Medications Prior to Visit  Medication Sig Dispense Refill  . albuterol (PROVENTIL HFA;VENTOLIN HFA) 108 (90 BASE) MCG/ACT inhaler Inhale 2 puffs into the lungs every 4 (four) hours as needed for wheezing or shortness of breath. 1 Inhaler 1  . aspirin EC 81 MG tablet Take 1 tablet (81 mg total) by mouth daily. 30 tablet 5  . beclomethasone (QVAR) 40 MCG/ACT inhaler Inhale 2 puffs into the lungs 2 (two) times daily. 1 Inhaler 12  . cetirizine (ZYRTEC) 10 MG tablet Take 1 tablet (10 mg total) by mouth daily. 30  tablet 11  . dicyclomine (BENTYL) 20 MG tablet Take 1 tablet (20 mg total) by mouth every 6 (six) hours as needed for spasms. (Patient not taking: Reported on 03/08/2016) 12 tablet 0  . diphenhydrAMINE (BENADRYL) 25 MG tablet Take 50 mg by mouth every 6 (six) hours as needed for allergies.    Marland Kitchen. FLUoxetine (PROZAC) 20 MG capsule Take 20 mg by mouth daily.    Marland Kitchen. gabapentin (NEURONTIN) 300 MG capsule Take 1 capsule (300 mg total) by mouth 2 (two) times daily. 60 capsule 3  . hydrOXYzine (VISTARIL) 25 MG capsule Take 25 mg by mouth 3 (three) times daily as needed for anxiety.    Marland Kitchen. ibuprofen (ADVIL,MOTRIN) 800 MG tablet Take 800 mg by mouth every 8 (eight) hours as needed for headache, mild pain or moderate pain.     . nitroGLYCERIN (NITROSTAT) 0.4 MG SL tablet Place 1 tablet (0.4 mg total) under the tongue every 5 (five) minutes as needed for chest pain. 50 tablet 3  . varenicline (CHANTIX CONTINUING MONTH PAK) 1 MG tablet Take 1 tablet (1 mg total) by mouth 2 (two) times daily. 60 tablet 1  . varenicline (CHANTIX STARTING MONTH PAK) 0.5 MG X 11 & 1 MG X 42 tablet Taper per packet insert 53 tablet 0   No facility-administered medications prior to visit.     ROS Review of Systems  Constitutional:  Negative for chills and fever.  Eyes: Negative for visual disturbance.  Respiratory: Positive for shortness of breath.   Cardiovascular: Positive for chest pain.  Gastrointestinal: Positive for abdominal pain and diarrhea. Negative for abdominal distention, anal bleeding, blood in stool, constipation, nausea, rectal pain and vomiting.  Musculoskeletal: Positive for myalgias. Negative for arthralgias and back pain.  Skin: Negative for rash.  Allergic/Immunologic: Negative for immunocompromised state.  Hematological: Negative for adenopathy. Does not bruise/bleed easily.  Psychiatric/Behavioral: Negative for dysphoric mood and suicidal ideas.    Objective:  BP 92/62 (BP Location: Right Arm, Patient  Position: Sitting, Cuff Size: Large)   Pulse 86   Temp 98.4 F (36.9 C) (Oral)   Ht 5\' 5"  (1.651 m)   Wt 297 lb 3.2 oz (134.8 kg)   SpO2 96%   PF 230 L/min   BMI 49.46 kg/m   BP/Weight 08/01/2016 03/08/2016 12/22/2015  Systolic BP 92 134 103  Diastolic BP 62 86 56  Wt. (Lbs) 297.2 270 270  BMI 49.46 44.93 46.32   PF Readings from Last 3 Encounters:  08/01/16 230 L/min    Physical Exam  Constitutional: She is oriented to person, place, and time. She appears well-developed and well-nourished. No distress.  Obese   HENT:  Head: Normocephalic and atraumatic.  Cardiovascular: Normal rate, regular rhythm, normal heart sounds and intact distal pulses.  Exam reveals no gallop and no friction rub.   No murmur heard. Pulmonary/Chest: Effort normal and breath sounds normal.  Abdominal: Soft. Bowel sounds are normal. She exhibits no distension and no mass. There is no tenderness. There is no rebound and no guarding.  Musculoskeletal: She exhibits no edema.  Neurological: She is alert and oriented to person, place, and time.  Skin: Skin is warm and dry. No rash noted.  Psychiatric: She has a normal mood and affect.   Assessment & Plan:  Camdynn was seen today for medication refill.  Diagnoses and all orders for this visit:  Asthma, chronic, mild intermittent, uncomplicated -     Ambulatory referral to Pulmonology -     Discontinue: albuterol (PROVENTIL HFA;VENTOLIN HFA) 108 (90 Base) MCG/ACT inhaler; Inhale 2 puffs into the lungs every 4 (four) hours as needed for wheezing or shortness of breath. -     Discontinue: cetirizine (ZYRTEC) 10 MG tablet; Take 1 tablet (10 mg total) by mouth daily. -     Discontinue: beclomethasone (QVAR) 40 MCG/ACT inhaler; Inhale 2 puffs into the lungs 2 (two) times daily. -     cetirizine (ZYRTEC) 10 MG tablet; Take 1 tablet (10 mg total) by mouth daily. -     beclomethasone (QVAR) 40 MCG/ACT inhaler; Inhale 2 puffs into the lungs 2 (two) times daily. -      albuterol (PROVENTIL HFA;VENTOLIN HFA) 108 (90 Base) MCG/ACT inhaler; Inhale 2 puffs into the lungs every 4 (four) hours as needed for wheezing or shortness of breath.  Ischemic chest pain (HCC) -     Ambulatory referral to Cardiology -     aspirin EC 81 MG tablet; Take 1 tablet (81 mg total) by mouth daily.  Chest pain, unspecified chest pain type -     EKG 12-Lead  Chronic diarrhea -     Cancel: Ova and parasite examination -     Ova and parasite examination; Future -     Ova and parasite examination  Morbid obesity, unspecified obesity type (HCC)  Gastroesophageal reflux disease, esophagitis presence not specified -     omeprazole (PRILOSEC)  20 MG capsule; Take 1 capsule (20 mg total) by mouth 2 (two) times daily before a meal.  Myalgia -     ibuprofen (ADVIL,MOTRIN) 800 MG tablet; Take 1 tablet (800 mg total) by mouth every 8 (eight) hours as needed for headache, mild pain or moderate pain.   There are no diagnoses linked to this encounter.  No orders of the defined types were placed in this encounter.   Follow-up: No Follow-up on file.   Dessa Phi MD

## 2016-08-01 NOTE — Patient Instructions (Addendum)
Emily Whitehead was seen today for medication refill.  Diagnoses and all orders for this visit:  Asthma, chronic, mild intermittent, uncomplicated -     Ambulatory referral to Pulmonology -     albuterol (PROVENTIL HFA;VENTOLIN HFA) 108 (90 Base) MCG/ACT inhaler; Inhale 2 puffs into the lungs every 4 (four) hours as needed for wheezing or shortness of breath. -     cetirizine (ZYRTEC) 10 MG tablet; Take 1 tablet (10 mg total) by mouth daily. -     beclomethasone (QVAR) 40 MCG/ACT inhaler; Inhale 2 puffs into the lungs 2 (two) times daily.  Ischemic chest pain (HCC) -     Ambulatory referral to Cardiology -     aspirin EC 81 MG tablet; Take 1 tablet (81 mg total) by mouth daily.  Chest pain, unspecified chest pain type -     EKG 12-Lead  Chronic diarrhea -     Cancel: Ova and parasite examination -     Ova and parasite examination; Future   Your EKG is unchanged from the one done in March, 2017.  I will call infectious disease discuss best approach to definitively diagnose Trichinellosis. I will be in touch with recommendations.   Smoking cessation support: smoking cessation hotline: 1-800-QUIT-NOW.  Smoking cessation classes are available through [facility] and [facility]. Call [phone] or visit our website at [email or web address].   F/u in 8 weeks for flu shot   Dr. Armen PickupFunches

## 2016-08-01 NOTE — Progress Notes (Signed)
C/C muscle aches all over body, bad diarrhea, SOB, cramps in abdomen, patient also states that she is having chest pains.

## 2016-08-02 DIAGNOSIS — K529 Noninfective gastroenteritis and colitis, unspecified: Secondary | ICD-10-CM | POA: Insufficient documentation

## 2016-08-02 NOTE — Assessment & Plan Note (Signed)
Myalgias with report of trichinellosis. There is no record of positive serology  Plan: Stool ova and parasites

## 2016-08-02 NOTE — Assessment & Plan Note (Signed)
Chronic asthma in smoker  continue albuterol Smoking cessation addressed

## 2016-08-02 NOTE — Assessment & Plan Note (Signed)
Chronic diarrhea with remote hx of trichinella without confirmed serology Start with stool O and P Unable to find trichinella serology in available orders

## 2016-08-03 LAB — OVA AND PARASITE EXAMINATION: OP: NONE SEEN

## 2016-08-03 NOTE — Progress Notes (Signed)
This case is fairly dubious and sounds like delusional parasitosis  I have NEVER hear of worms coming through a persons skin with trichinella. If she has actual worms in stool a stool ova and parasite might prove this or disprove this. Does she have eosinophilia and has anyone done a CXR. Sorry but this reeks of delusional parasitosis

## 2016-08-04 ENCOUNTER — Telehealth: Payer: Self-pay

## 2016-08-04 NOTE — Telephone Encounter (Signed)
Patient was called and informed of results. 

## 2016-08-26 ENCOUNTER — Ambulatory Visit (INDEPENDENT_AMBULATORY_CARE_PROVIDER_SITE_OTHER): Payer: No Typology Code available for payment source | Admitting: Pulmonary Disease

## 2016-08-26 ENCOUNTER — Encounter (INDEPENDENT_AMBULATORY_CARE_PROVIDER_SITE_OTHER): Payer: Self-pay

## 2016-08-26 ENCOUNTER — Telehealth: Payer: Self-pay | Admitting: Family Medicine

## 2016-08-26 ENCOUNTER — Ambulatory Visit (INDEPENDENT_AMBULATORY_CARE_PROVIDER_SITE_OTHER)
Admission: RE | Admit: 2016-08-26 | Discharge: 2016-08-26 | Disposition: A | Discharge status: Home / Self Care | Payer: No Typology Code available for payment source | Source: Ambulatory Visit | Attending: Pulmonary Disease | Admitting: Pulmonary Disease

## 2016-08-26 ENCOUNTER — Encounter: Payer: Self-pay | Admitting: Pulmonary Disease

## 2016-08-26 ENCOUNTER — Other Ambulatory Visit (INDEPENDENT_AMBULATORY_CARE_PROVIDER_SITE_OTHER): Payer: No Typology Code available for payment source

## 2016-08-26 VITALS — BP 100/82 | HR 81 | Ht 65.0 in | Wt 300.4 lb

## 2016-08-26 DIAGNOSIS — J4520 Mild intermittent asthma, uncomplicated: Secondary | ICD-10-CM

## 2016-08-26 DIAGNOSIS — F40218 Other animal type phobia: Secondary | ICD-10-CM

## 2016-08-26 LAB — CBC WITH DIFFERENTIAL/PLATELET
Basophils Absolute: 0 10*3/uL (ref 0.0–0.1)
Basophils Relative: 0.2 % (ref 0.0–3.0)
EOS PCT: 1.5 % (ref 0.0–5.0)
Eosinophils Absolute: 0.1 10*3/uL (ref 0.0–0.7)
HEMATOCRIT: 43.9 % (ref 36.0–46.0)
HEMOGLOBIN: 15.4 g/dL — AB (ref 12.0–15.0)
LYMPHS PCT: 15.8 % (ref 12.0–46.0)
Lymphs Abs: 1.2 10*3/uL (ref 0.7–4.0)
MCHC: 35.2 g/dL (ref 30.0–36.0)
MCV: 90.3 fl (ref 78.0–100.0)
MONO ABS: 0.5 10*3/uL (ref 0.1–1.0)
MONOS PCT: 6.3 % (ref 3.0–12.0)
Neutro Abs: 6 10*3/uL (ref 1.4–7.7)
Neutrophils Relative %: 76.2 % (ref 43.0–77.0)
Platelets: 202 10*3/uL (ref 150.0–400.0)
RBC: 4.87 Mil/uL (ref 3.87–5.11)
RDW: 13.4 % (ref 11.5–15.5)
WBC: 7.9 10*3/uL (ref 4.0–10.5)

## 2016-08-26 MED ORDER — BECLOMETHASONE DIPROPIONATE 40 MCG/ACT IN AERS: 2.0000 | INHALATION_SPRAY | Freq: Two times a day (BID) | RESPIRATORY_TRACT | 0 refills | Status: AC

## 2016-08-26 NOTE — Patient Instructions (Signed)
We'll send her for a blood allergy panel, CBC with differential. She'll get a chest x-ray and be scheduled for pulmonary function test. We will check an FeNO  Please take the Qvar as directed 2 puffs twice daily. Continue the albuterol rescue inhaler.  Return to clinic after these tests for further evaluation.

## 2016-08-26 NOTE — Progress Notes (Signed)
Emily Whitehead    [id number]    [birth year]  Primary Care Physician:FUNCHES, Ellison Carwin, MD  Referring Physician: Dessa Phi, MD [address] [place], [place] [address]  Chief complaint:  Consult for evaluation of asthma  HPI: Emily Whitehead is a 45 year old with possible history of asthma, active smoker. She has been referred here for further management of her asthma. She has daily symptoms of cough, sputum production, dyspnea, wheezing. She is on albuterol inhaler which she uses only a few times every week. She is also on Qvar inhaler which she takes every other day since she cannot afford the medication.  She reports that she was infected with Trichinella after eating uncooked pork about 20 years ago. She had been treated with albendazole in the past for 14 days but she did not repeat the course 2 weeks later. She complains of chronic muscle pain, reports parasite larvae coming out of her skin and also in the sputum. She had stool ova parasite done recently which was normal.  Outpatient Encounter Prescriptions as of 08/26/2016  Medication Sig  . albuterol (PROVENTIL HFA;VENTOLIN HFA) 108 (90 Base) MCG/ACT inhaler Inhale 2 puffs into the lungs every 4 (four) hours as needed for wheezing or shortness of breath.  Marland Kitchen aspirin EC 81 MG tablet Take 1 tablet (81 mg total) by mouth daily.  . cetirizine (ZYRTEC) 10 MG tablet Take 1 tablet (10 mg total) by mouth daily.  . diphenhydrAMINE (BENADRYL) 25 MG tablet Take 50 mg by mouth every 6 (six) hours as needed for allergies.  Marland Kitchen ibuprofen (ADVIL,MOTRIN) 800 MG tablet Take 1 tablet (800 mg total) by mouth every 8 (eight) hours as needed for headache, mild pain or moderate pain.  . nitroGLYCERIN (NITROSTAT) 0.4 MG SL tablet Place 1 tablet (0.4 mg total) under the tongue every 5 (five) minutes as needed for chest pain.  Marland Kitchen omeprazole (PRILOSEC) 20 MG capsule Take 1 capsule (20 mg total) by mouth 2 (two) times daily before a meal.  .  beclomethasone (QVAR) 40 MCG/ACT inhaler Inhale 2 puffs into the lungs 2 (two) times daily. (Patient not taking: Reported on 08/26/2016)  . FLUoxetine (PROZAC) 20 MG capsule Take 20 mg by mouth daily.  Marland Kitchen gabapentin (NEURONTIN) 300 MG capsule Take 1 capsule (300 mg total) by mouth 2 (two) times daily. (Patient not taking: Reported on 08/26/2016)  . hydrOXYzine (VISTARIL) 25 MG capsule Take 25 mg by mouth 3 (three) times daily as needed for anxiety.  . varenicline (CHANTIX CONTINUING MONTH PAK) 1 MG tablet Take 1 tablet (1 mg total) by mouth 2 (two) times daily. (Patient not taking: Reported on 08/26/2016)  . varenicline (CHANTIX STARTING MONTH PAK) 0.5 MG X 11 & 1 MG X 42 tablet Taper per packet insert (Patient not taking: Reported on 08/26/2016)   No facility-administered encounter medications on file as of 08/26/2016.     Allergies as of 08/26/2016 - Review Complete 08/26/2016  Allergen Reaction Noted  . Aspirin Hives 03/31/2012    Past Medical History:  Diagnosis Date  . Asthma     Past Surgical History:  Procedure Laterality Date  . DNC      Family History  Problem Relation Age of Onset  . Cancer Mother     ovarian cancer     Social History   Social History  . Marital status: Married    Spouse name: N/A  . Number of children: N/A  . Years of education: N/A  Occupational History  . Not on file.   Social History Main Topics  . Smoking status: Current Every Day Smoker    Packs/day: 0.25    Years: 25.00    Types: Cigarettes  . Smokeless tobacco: Never Used  . Alcohol use No  . Drug use: No  . Sexual activity: Not on file   Other Topics Concern  . Not on file   Social History Narrative  . No narrative on file     Review of systems: Review of Systems  Constitutional: Negative for fever and chills.  HENT: Negative.   Eyes: Negative for blurred vision.  Respiratory: as per HPI  Cardiovascular: Negative for chest pain and palpitations.  Gastrointestinal: Negative  for vomiting, diarrhea, blood per rectum. Genitourinary: Negative for dysuria, urgency, frequency and hematuria.  Musculoskeletal: Negative for myalgias, back pain and joint pain.  Skin: Negative for itching and rash.  Neurological: Negative for dizziness, tremors, focal weakness, seizures and loss of consciousness.  Endo/Heme/Allergies: Negative for environmental allergies.  Psychiatric/Behavioral: Negative for depression, suicidal ideas and hallucinations.  All other systems reviewed and are negative.   Physical Exam: Blood pressure 100/82, pulse 81, height 5\' 5"  (1.651 m), weight (!) 300 lb 6.4 oz (136.3 kg), SpO2 94 %. Gen:      No acute distress HEENT:  EOMI, sclera anicteric Neck:     No masses; no thyromegaly Lungs:    Clear to auscultation bilaterally; normal respiratory effort CV:         Regular rate and rhythm; no murmurs Abd:      + bowel sounds; soft, non-tender; no palpable masses, no distension Ext:    No edema; adequate peripheral perfusion Skin:      Warm and dry; no rash Neuro: alert and oriented x 3 Psych: normal mood and affect  Data Reviewed: CT chest 09/08/14- No acute cardiopulmonary abnormality CXR 12/22/15- Chronic bronchitic changes. No acute abnormality.  Assessment:  Consult for evaluation of asthma.  She is being maintained on Qvar inhaler but is taking this only intermittently. I have encouraged her to take this on a regular basis 2 puffs twice daily. She continues on albuterol rescue inhaler. I will evaluate her asthma further by checking for CBC with differential to rule out eosinophilia, blood allergy panel with IgE levels. 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 Lung involvement with trichinella can cause resp issues but her chest imaging in past had been normal, there is no eosinophilia and stool ova parasite are negative.   Plan/Recommendations: - Check CBC with diff, Blood allergy panel, CXR - FeNO, PFTs -  Continue qVAR and albuterol rescue inhaler.  Chilton GreathousePraveen Jasmeet Manton MD [facility] Pager [phone] 08/26/2016, 2:48 PM  CC: Dessa PhiFunches, Josalyn, MD

## 2016-08-26 NOTE — Telephone Encounter (Signed)
Pt. Called stating that her PCP was going to see if she could be referred to infectious disease. Please f/u with pt.

## 2016-08-30 LAB — RESPIRATORY ALLERGY PROFILE REGION II ~~LOC~~
ALLERGEN, D PTERNOYSSINUS, D1: 4.1 kU/L — AB
Allergen, C. Herbarum, M2: <0.1 kU/L
Allergen, Comm Silver Birch, t9: <0.1 kU/L
Allergen, Cottonwood, t14: <0.1 kU/L
Allergen, Mulberry, t76: <0.1 kU/L
Allergen, P. notatum, m1: <0.1 kU/L
Aspergillus fumigatus, m3: <0.1 kU/L
Bermuda Grass: <0.1 kU/L
CAT DANDER: 0.77 kU/L — AB
Cockroach: 0.48 kU/L — ABNORMAL HIGH
Common Ragweed: <0.1 kU/L
D. FARINAE: 1.92 kU/L — AB
IGE (IMMUNOGLOBULIN E), SERUM: 175 kU/L — AB (ref <115)
Pecan/Hickory Tree IgE: <0.1 kU/L
Timothy Grass: <0.1 kU/L

## 2016-09-01 NOTE — Telephone Encounter (Signed)
Pt states she has trichomoniasis and needs a referral.

## 2016-09-02 MED ORDER — ALBENDAZOLE 200 MG PO TABS: 400.0000 mg | ORAL_TABLET | Freq: Two times a day (BID) | ORAL | 0 refills | Status: AC

## 2016-09-02 NOTE — Telephone Encounter (Signed)
Please inform patient that I received recommendations from infectious disease. A referral is not necessary given her normal studies, negative stool ova and parasite, normal CXR, normal CBC with diff.    Since she is still feeling parasites, I have decided  will retreat her with albenza, take 400 mg twice daily for 14 days with a fatty meal.

## 2016-09-07 NOTE — Telephone Encounter (Signed)
Pt was called on 9/13, a VM was left for pt.

## 2016-09-13 NOTE — Telephone Encounter (Signed)
Pt. Returned call. Pt. States that her phone is not working and would like to know if the nurse can text her. Please f/u

## 2016-09-23 ENCOUNTER — Ambulatory Visit: Payer: Self-pay | Attending: Internal Medicine

## 2016-09-23 DIAGNOSIS — Z23 Encounter for immunization: Secondary | ICD-10-CM

## 2016-09-23 NOTE — Progress Notes (Signed)
Pt in today for flu and tb shot  Pt appears intoxicated  Informed pt she would have to return on Monday for tb due to preset guidelines for reading time

## 2016-09-26 ENCOUNTER — Ambulatory Visit: Payer: No Typology Code available for payment source

## 2016-10-03 ENCOUNTER — Ambulatory Visit: Payer: No Typology Code available for payment source | Attending: Internal Medicine

## 2016-10-03 DIAGNOSIS — Z111 Encounter for screening for respiratory tuberculosis: Secondary | ICD-10-CM

## 2016-10-05 ENCOUNTER — Ambulatory Visit: Payer: No Typology Code available for payment source

## 2016-10-10 ENCOUNTER — Ambulatory Visit: Payer: No Typology Code available for payment source | Attending: Family Medicine

## 2016-10-10 DIAGNOSIS — Z111 Encounter for screening for respiratory tuberculosis: Secondary | ICD-10-CM

## 2016-10-10 MED ORDER — TUBERCULIN PPD 5 UNIT/0.1ML ID SOLN
5.0000 [IU] | Freq: Once | INTRADERMAL | Status: AC
  Administered 2016-10-10: 5 [IU] via INTRADERMAL

## 2016-10-10 NOTE — Progress Notes (Signed)
Pt is in the office today for a TB skin test

## 2016-10-12 ENCOUNTER — Ambulatory Visit: Payer: No Typology Code available for payment source | Attending: Internal Medicine

## 2016-10-12 DIAGNOSIS — Z111 Encounter for screening for respiratory tuberculosis: Secondary | ICD-10-CM | POA: Insufficient documentation

## 2016-10-12 LAB — TB SKIN TEST
INDURATION: 0 mm
TB SKIN TEST: NEGATIVE

## 2016-10-12 NOTE — Patient Instructions (Signed)
Patient here for PPD reading only. 

## 2016-10-12 NOTE — Progress Notes (Signed)
PPD Reading Note  PPD read and results entered in EpicCare.  Result: 0 mm induration.  Interpretation: negative

## 2016-11-25 ENCOUNTER — Ambulatory Visit: Payer: No Typology Code available for payment source | Admitting: Pulmonary Disease

## 2017-03-07 IMAGING — DX DG CHEST 2V
2 series · 2 of 2 positions shown · non-contrast
Comparison: 12/22/2015 chest radiograph.

CLINICAL DATA: 45 y/o F; chronic productive cough with congestion
and shortness of breath.

EXAM:
CHEST  2 VIEW

[chest pa]
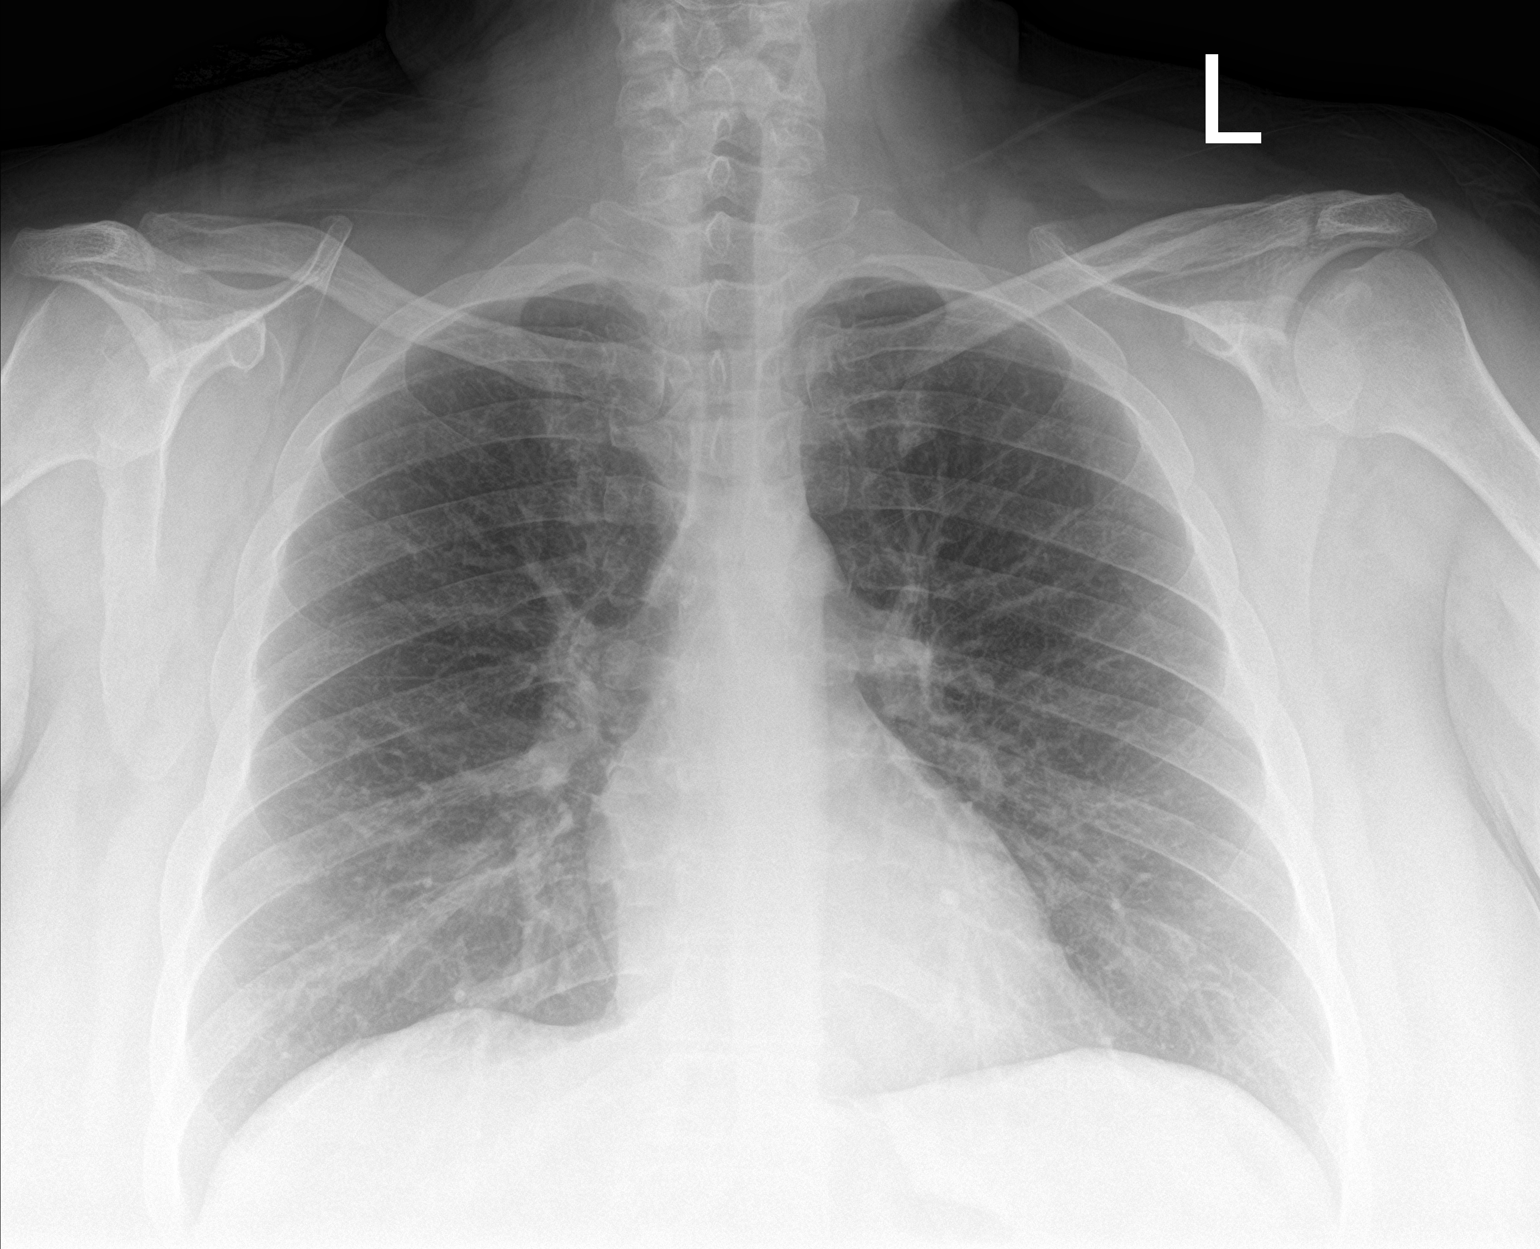

[chest lat]
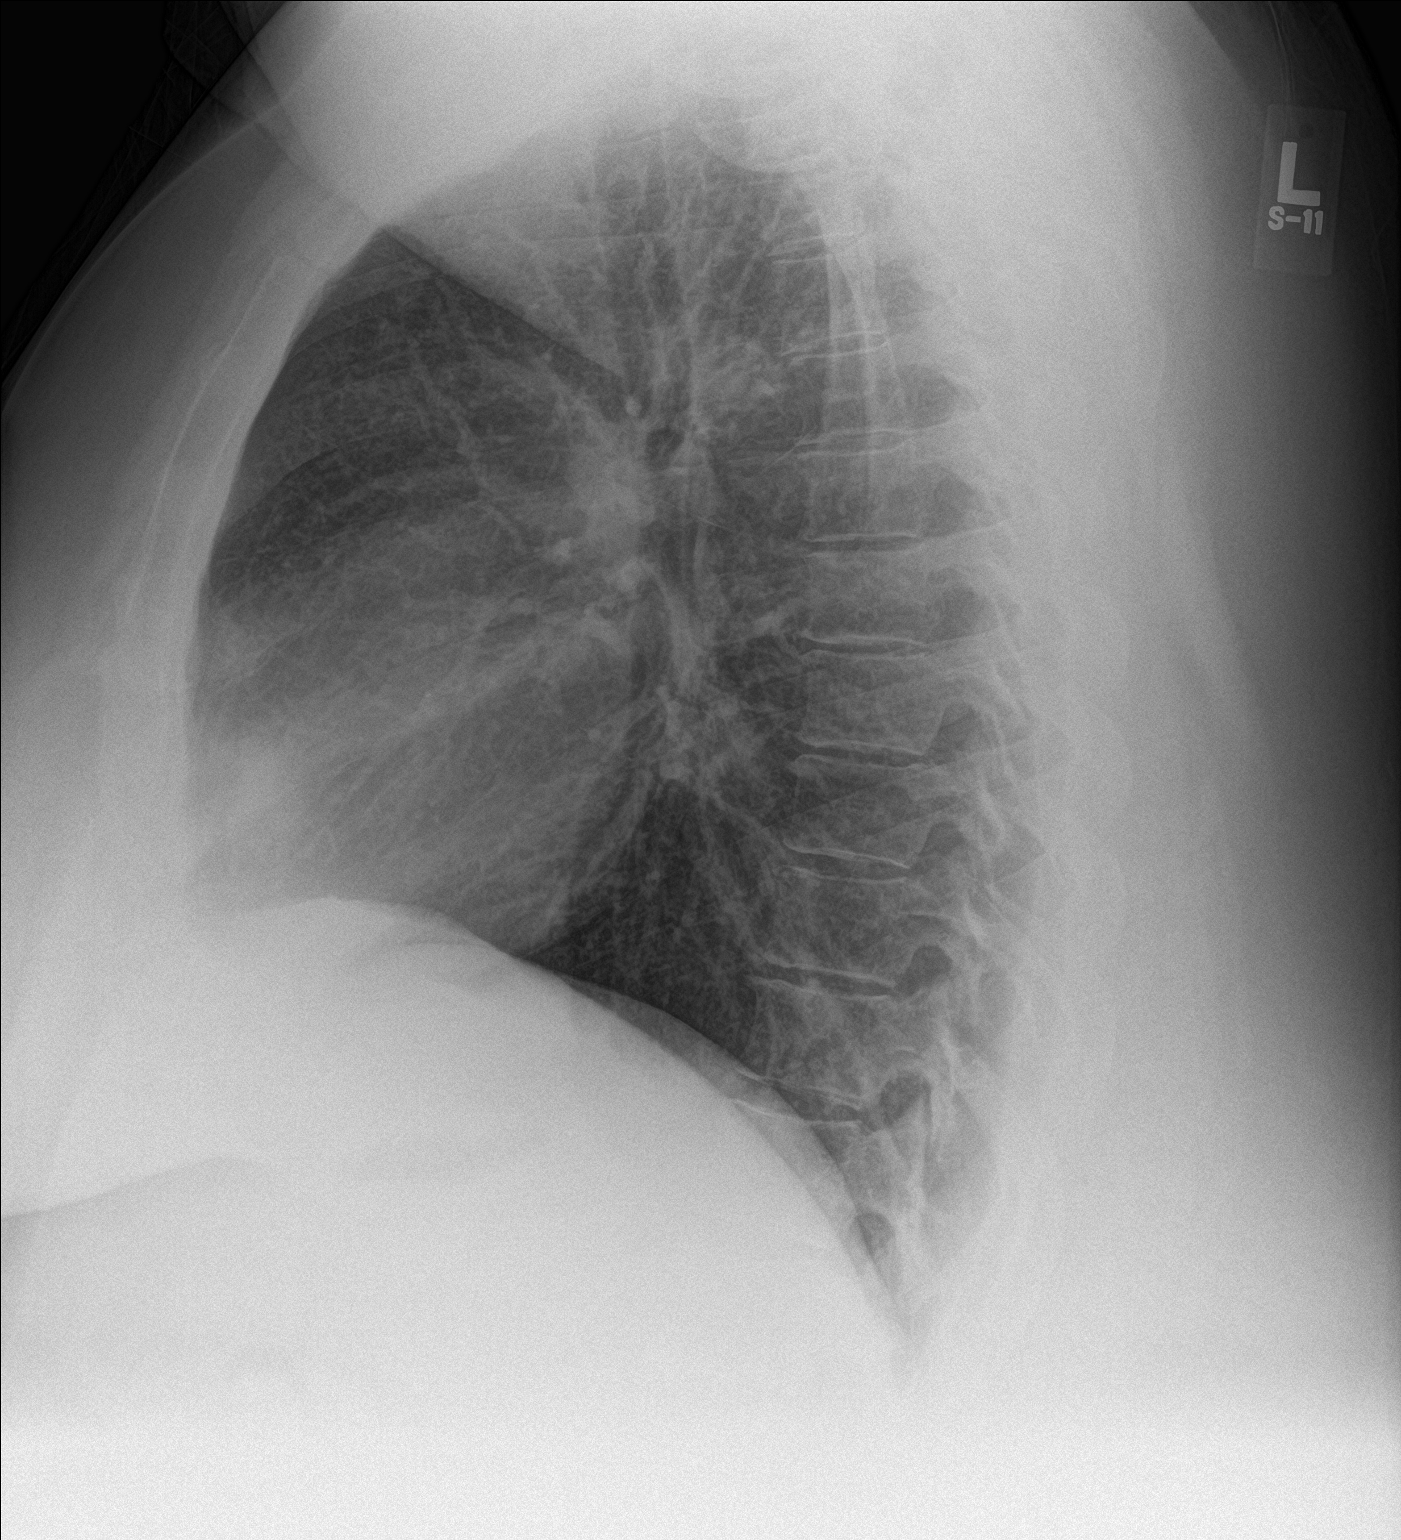

[2 of 2 positions shown; findings below may reference images not displayed]

FINDINGS: The heart size and mediastinal contours are within normal limits.
Both lungs are clear. The visualized skeletal structures are
unremarkable.
IMPRESSION: No active cardiopulmonary disease.

By: Athan Blossom M.D.

## 2017-05-08 ENCOUNTER — Emergency Department (HOSPITAL_COMMUNITY)
Admission: EM | Admit: 2017-05-08 | Discharge: 2017-05-08 | Disposition: A | Discharge status: Home / Self Care | Payer: No Typology Code available for payment source | Attending: Emergency Medicine | Admitting: Emergency Medicine

## 2017-05-08 ENCOUNTER — Encounter (HOSPITAL_COMMUNITY): Payer: Self-pay | Admitting: Emergency Medicine

## 2017-05-08 DIAGNOSIS — F17210 Nicotine dependence, cigarettes, uncomplicated: Secondary | ICD-10-CM | POA: Insufficient documentation

## 2017-05-08 DIAGNOSIS — J4520 Mild intermittent asthma, uncomplicated: Secondary | ICD-10-CM | POA: Insufficient documentation

## 2017-05-08 DIAGNOSIS — Z7982 Long term (current) use of aspirin: Secondary | ICD-10-CM | POA: Insufficient documentation

## 2017-05-08 DIAGNOSIS — J302 Other seasonal allergic rhinitis: Secondary | ICD-10-CM | POA: Insufficient documentation

## 2017-05-08 DIAGNOSIS — Z9109 Other allergy status, other than to drugs and biological substances: Secondary | ICD-10-CM

## 2017-05-08 DIAGNOSIS — E876 Hypokalemia: Secondary | ICD-10-CM | POA: Insufficient documentation

## 2017-05-08 LAB — CBC
HCT: 42.3 % (ref 36.0–46.0)
Hemoglobin: 14.2 g/dL (ref 12.0–15.0)
MCH: 31.6 pg (ref 26.0–34.0)
MCHC: 33.6 g/dL (ref 30.0–36.0)
MCV: 94 fL (ref 78.0–100.0)
PLATELETS: 165 10*3/uL (ref 150–400)
RBC: 4.5 MIL/uL (ref 3.87–5.11)
RDW: 13.9 % (ref 11.5–15.5)
WBC: 11 10*3/uL — ABNORMAL HIGH (ref 4.0–10.5)

## 2017-05-08 LAB — BASIC METABOLIC PANEL
Anion gap: 13 (ref 5–15)
BUN: 14 mg/dL (ref 6–20)
CALCIUM: 8.6 mg/dL — AB (ref 8.9–10.3)
CHLORIDE: 103 mmol/L (ref 101–111)
CO2: 23 mmol/L (ref 22–32)
CREATININE: 0.87 mg/dL (ref 0.44–1.00)
GFR calc non Af Amer: >60 mL/min (ref >60)
Glucose, Bld: 82 mg/dL (ref 65–99)
Potassium: 3.3 mmol/L — ABNORMAL LOW (ref 3.5–5.1)
SODIUM: 139 mmol/L (ref 135–145)

## 2017-05-08 LAB — ETHANOL: ALCOHOL ETHYL (B): 38 mg/dL — AB (ref <5)

## 2017-05-08 MED ORDER — POTASSIUM CHLORIDE CRYS ER 20 MEQ PO TBCR
40.0000 meq | EXTENDED_RELEASE_TABLET | Freq: Once | ORAL | Status: AC
  Administered 2017-05-08: 40 meq via ORAL
  Filled 2017-05-08: qty 2

## 2017-05-08 MED ORDER — ALBUTEROL SULFATE HFA 108 (90 BASE) MCG/ACT IN AERS
2.0000 | INHALATION_SPRAY | RESPIRATORY_TRACT | Status: DC | PRN
  Administered 2017-05-08: 2 via RESPIRATORY_TRACT
  Filled 2017-05-08: qty 6.7

## 2017-05-08 MED ORDER — ALBUTEROL SULFATE (2.5 MG/3ML) 0.083% IN NEBU
5.0000 mg | INHALATION_SOLUTION | Freq: Once | RESPIRATORY_TRACT | Status: AC
  Administered 2017-05-08: 5 mg via RESPIRATORY_TRACT
  Filled 2017-05-08: qty 6

## 2017-05-08 MED ORDER — ALBUTEROL SULFATE HFA 108 (90 BASE) MCG/ACT IN AERS: 2.0000 | INHALATION_SPRAY | RESPIRATORY_TRACT | 1 refills | Status: DC | PRN

## 2017-05-08 NOTE — Discharge Instructions (Signed)
It was our pleasure to provide your ER care today - we hope that you feel better.  Use albuterol inhaler as need.   If allergy symptoms, try zyrtec or claritin as need.  Avoid smoking.  Your potassium level is mildly low (3.3) - eat plenty of fruits and vegetables.   Follow up with primary care doctor in the next 1-2 weeks.  Return to ER if worse, increased difficulty breathing, other concern.

## 2017-05-08 NOTE — ED Provider Notes (Signed)
[facility]-EMERGENCY DEPT Provider Note   CSN: [id number] Arrival date & time: 05/08/17  0534     History   Chief Complaint Chief Complaint  Patient presents with  . Asthma    HPI Emily Whitehead is a 46 y.o. female.  Patient w hx asthma, c/o increased sob this AM. States was at a [employer], when she felt sob/wheezing. States history of same. Indicates that nearby someone had cut grass, and that exacerbates her asthma.  States also recently ran out of inhaler.  Pt unclear historian - unsure what she was doing at [employer], or how long there - level 5 caveat.  Denies cough or uri c/o. No fever or chills. No chest pain. States tongue felt swollen earlier, but that is better now. No recent change in meds or new meds. +smoker.    The history is provided by the patient.  Asthma  Associated symptoms include shortness of breath. Pertinent negatives include no chest pain, no abdominal pain and no headaches.    Past Medical History:  Diagnosis Date  . Asthma     Patient Active Problem List   Diagnosis Date Noted  . Chronic diarrhea 08/02/2016  . Morbid obesity (HCC) 08/01/2016  . GERD (gastroesophageal reflux disease) 08/01/2016  . Myalgia 08/01/2016  . Asthma, chronic 03/08/2016  . Chest pain 03/08/2016  . Norplant in place 03/08/2016  . Hot flashes 03/08/2016  . Neuropathy 03/08/2016  . Shoulder pain, right 08/05/2015  . Smoking 11/11/2014  . Lumbar transverse process fracture (HCC) 09/08/2014  . Right ankle sprain 09/08/2014    Past Surgical History:  Procedure Laterality Date  . DNC      OB History    No data available       Home Medications    Prior to Admission medications   Medication Sig Start Date End Date Taking? Authorizing Provider  albendazole (ALBENZA) 200 MG tablet Take 2 tablets (400 mg total) by mouth 2 (two) times daily. 09/02/16   Funches, Gerilyn Nestle, MD  albuterol (PROVENTIL HFA;VENTOLIN HFA) 108 (90 Base) MCG/ACT inhaler Inhale 2 puffs into the  lungs every 4 (four) hours as needed for wheezing or shortness of breath. 08/01/16   Dessa Phi, MD  aspirin EC 81 MG tablet Take 1 tablet (81 mg total) by mouth daily. 08/01/16   Funches, Gerilyn Nestle, MD  beclomethasone (QVAR) 40 MCG/ACT inhaler Inhale 2 puffs into the lungs 2 (two) times daily. Patient not taking: Reported on 08/26/2016 08/01/16   Dessa Phi, MD  beclomethasone (QVAR) 40 MCG/ACT inhaler Inhale 2 puffs into the lungs 2 (two) times daily. 08/26/16 08/28/16  Chilton Greathouse, MD  cetirizine (ZYRTEC) 10 MG tablet Take 1 tablet (10 mg total) by mouth daily. 08/01/16   Funches, Gerilyn Nestle, MD  diphenhydrAMINE (BENADRYL) 25 MG tablet Take 50 mg by mouth every 6 (six) hours as needed for allergies.    [provider]  FLUoxetine (PROZAC) 20 MG capsule Take 20 mg by mouth daily.    [provider]  gabapentin (NEURONTIN) 300 MG capsule Take 1 capsule (300 mg total) by mouth 2 (two) times daily. Patient not taking: Reported on 08/26/2016 03/08/16   Dessa Phi, MD  hydrOXYzine (VISTARIL) 25 MG capsule Take 25 mg by mouth 3 (three) times daily as needed for anxiety.    [provider]  ibuprofen (ADVIL,MOTRIN) 800 MG tablet Take 1 tablet (800 mg total) by mouth every 8 (eight) hours as needed for headache, mild pain or moderate pain. 08/01/16   Funches,  Josalyn, MD  nitroGLYCERIN (NITROSTAT) 0.4 MG SL tablet Place 1 tablet (0.4 mg total) under the tongue every 5 (five) minutes as needed for chest pain. 03/08/16   Funches, Gerilyn Nestle, MD  omeprazole (PRILOSEC) 20 MG capsule Take 1 capsule (20 mg total) by mouth 2 (two) times daily before a meal. 08/01/16   Funches, Josalyn, MD  varenicline (CHANTIX CONTINUING MONTH PAK) 1 MG tablet Take 1 tablet (1 mg total) by mouth 2 (two) times daily. Patient not taking: Reported on 08/26/2016 03/08/16   Dessa Phi, MD  varenicline (CHANTIX STARTING MONTH PAK) 0.5 MG X 11 & 1 MG X 42 tablet Taper per packet insert Patient not taking: Reported  on 08/26/2016 03/08/16   Dessa Phi, MD    Family History Family History  Problem Relation Age of Onset  . Cancer Mother        ovarian cancer     Social History Social History  Substance Use Topics  . Smoking status: Current Every Day Smoker    Packs/day: 0.25    Years: 25.00    Types: Cigarettes  . Smokeless tobacco: Never Used  . Alcohol use No     Allergies   Aspirin   Review of Systems Review of Systems  Constitutional: Negative for fever.  HENT: Negative for sore throat.   Eyes: Negative for redness.  Respiratory: Positive for shortness of breath and wheezing.   Cardiovascular: Negative for chest pain.  Gastrointestinal: Negative for abdominal pain.  Genitourinary: Negative for flank pain.  Musculoskeletal: Negative for back pain and neck pain.  Skin: Negative for rash.  Neurological: Negative for headaches.  Hematological: Does not bruise/bleed easily.  Psychiatric/Behavioral: Negative for dysphoric mood.     Physical Exam Updated Vital Signs BP 114/73 (BP Location: Left Arm)   Pulse 97   Temp 97.3 F (36.3 C) (Oral)   Resp 18   Ht 5\' 4"  (1.626 m)   Wt 136.1 kg   SpO2 96%   BMI 51.49 kg/m   Physical Exam  Constitutional: She appears well-developed and well-nourished. No distress.  HENT:  Head: Atraumatic.  Nose: Nose normal.  Mouth/Throat: Oropharynx is clear and moist.  No oral or tongue swelling.   Eyes: Conjunctivae are normal. No scleral icterus.  Neck: Neck supple. No tracheal deviation present.  Cardiovascular: Normal rate, regular rhythm, normal heart sounds and intact distal pulses.  Exam reveals no gallop and no friction rub.   No murmur heard. Pulmonary/Chest: Effort normal. No respiratory distress. She has wheezes.  Slight wheeze.   Abdominal: Soft. Normal appearance. She exhibits no distension. There is no tenderness.  Musculoskeletal: She exhibits no edema.  Lymphadenopathy:    She has no cervical adenopathy.    Neurological: She is alert.  Skin: Skin is warm and dry. No rash noted. She is not diaphoretic.  Psychiatric: She has a normal mood and affect.  Nursing note and vitals reviewed.    ED Treatments / Results  Labs (all labs ordered are listed, but only abnormal results are displayed) Results for orders placed or performed during the hospital encounter of 05/08/17  Ethanol  Result Value Ref Range   Alcohol, Ethyl (B) 38 (H) <5 mg/dL  Basic metabolic panel  Result Value Ref Range   Sodium 139 135 - 145 mmol/L   Potassium 3.3 (L) 3.5 - 5.1 mmol/L   Chloride 103 101 - 111 mmol/L   CO2 23 22 - 32 mmol/L   Glucose, Bld 82 65 - 99 mg/dL  BUN 14 6 - 20 mg/dL   Creatinine, Ser 9.140.87 0.44 - 1.00 mg/dL   Calcium 8.6 (L) 8.9 - 10.3 mg/dL   GFR calc non Af Amer >60 >60 mL/min   GFR calc Af Amer >60 >60 mL/min   Anion gap 13 5 - 15  CBC  Result Value Ref Range   WBC 11.0 (H) 4.0 - 10.5 K/uL   RBC 4.50 3.87 - 5.11 MIL/uL   Hemoglobin 14.2 12.0 - 15.0 g/dL   HCT 78.242.3 95.636.0 - 21.346.0 %   MCV 94.0 78.0 - 100.0 fL   MCH 31.6 26.0 - 34.0 pg   MCHC 33.6 30.0 - 36.0 g/dL   RDW 08.613.9 57.811.5 - 46.915.5 %   Platelets 165 150 - 400 K/uL   EKG  EKG Interpretation None       Radiology No results found.  Procedures Procedures (including critical care time)  Medications Ordered in ED Medications  albuterol (PROVENTIL) (2.5 MG/3ML) 0.083% nebulizer solution 5 mg (not administered)     Initial Impression / Assessment and Plan / ED Course  I have reviewed the triage vital signs and the nursing notes.  Pertinent labs & imaging results that were available during my care of the patient were reviewed by me and considered in my medical decision making (see chart for details).  Pt requests neb tx.  Albuterol neb.  Pt requests mdi for home - provided.  Recheck, no wheezing or increased wob.   Reviewed nursing notes and prior charts for additional history.   k mildly low, kcl po.  Recheck,  resting comfortably. No increased wob.  Pt currently appears stable for d/c.      Final Clinical Impressions(s) / ED Diagnoses   Final diagnoses:  None    New Prescriptions New Prescriptions   No medications on file     Cathren LaineSteinl, Henleigh Robello, MD 05/08/17 [phone]

## 2017-05-08 NOTE — ED Triage Notes (Signed)
Pt in via EMS with c/o, retrieved pt from a [employer] in an area pt reports that she does not normally go to, with shortness of breath and feeling like her tongue is swollen because she may have bit it. Pt talking in complete sentences. Pt reported to EMS that someone was cutting grass near her and that brings on her asthma symptoms. Pt admitted to drinking beer and reported that her shortness of breath resolved after EMS got her out of her car.

## 2017-05-10 ENCOUNTER — Encounter: Payer: Self-pay | Admitting: Family Medicine

## 2017-08-28 ENCOUNTER — Emergency Department (HOSPITAL_COMMUNITY)
Admission: EM | Admit: 2017-08-28 | Discharge: 2017-08-28 | Disposition: A | Discharge status: Home / Self Care | Payer: Self-pay | Attending: Emergency Medicine | Admitting: Emergency Medicine

## 2017-08-28 ENCOUNTER — Emergency Department (HOSPITAL_COMMUNITY): Payer: Self-pay

## 2017-08-28 ENCOUNTER — Encounter (HOSPITAL_COMMUNITY): Payer: Self-pay

## 2017-08-28 DIAGNOSIS — R1084 Generalized abdominal pain: Secondary | ICD-10-CM | POA: Insufficient documentation

## 2017-08-28 DIAGNOSIS — J45909 Unspecified asthma, uncomplicated: Secondary | ICD-10-CM | POA: Insufficient documentation

## 2017-08-28 DIAGNOSIS — F17210 Nicotine dependence, cigarettes, uncomplicated: Secondary | ICD-10-CM | POA: Insufficient documentation

## 2017-08-28 DIAGNOSIS — R062 Wheezing: Secondary | ICD-10-CM | POA: Insufficient documentation

## 2017-08-28 LAB — URINALYSIS, ROUTINE W REFLEX MICROSCOPIC
BACTERIA UA: NONE SEEN
Glucose, UA: NEGATIVE mg/dL
Hgb urine dipstick: NEGATIVE
Ketones, ur: NEGATIVE mg/dL
NITRITE: NEGATIVE
Protein, ur: >=300 mg/dL — AB
RBC / HPF: NONE SEEN RBC/hpf (ref 0–5)
SPECIFIC GRAVITY, URINE: 1.026 (ref 1.005–1.030)
WBC, UA: NONE SEEN WBC/hpf (ref 0–5)
pH: 8 (ref 5.0–8.0)

## 2017-08-28 LAB — WET PREP, GENITAL
CLUE CELLS WET PREP: NONE SEEN
Sperm: NONE SEEN
Trich, Wet Prep: NONE SEEN
WBC WET PREP: NONE SEEN
YEAST WET PREP: NONE SEEN

## 2017-08-28 LAB — CBC
HEMATOCRIT: 44.3 % (ref 36.0–46.0)
Hemoglobin: 14.8 g/dL (ref 12.0–15.0)
MCH: 31.5 pg (ref 26.0–34.0)
MCHC: 33.4 g/dL (ref 30.0–36.0)
MCV: 94.3 fL (ref 78.0–100.0)
PLATELETS: 188 10*3/uL (ref 150–400)
RBC: 4.7 MIL/uL (ref 3.87–5.11)
RDW: 13.5 % (ref 11.5–15.5)
WBC: 26.9 10*3/uL — ABNORMAL HIGH (ref 4.0–10.5)

## 2017-08-28 LAB — COMPREHENSIVE METABOLIC PANEL
ALBUMIN: 4 g/dL (ref 3.5–5.0)
ALT: 25 U/L (ref 14–54)
AST: 28 U/L (ref 15–41)
Alkaline Phosphatase: 53 U/L (ref 38–126)
Anion gap: 11 (ref 5–15)
BILIRUBIN TOTAL: 1 mg/dL (ref 0.3–1.2)
BUN: 20 mg/dL (ref 6–20)
CO2: 23 mmol/L (ref 22–32)
Calcium: 9.2 mg/dL (ref 8.9–10.3)
Chloride: 106 mmol/L (ref 101–111)
Creatinine, Ser: 1.01 mg/dL — ABNORMAL HIGH (ref 0.44–1.00)
GFR calc Af Amer: >60 mL/min (ref >60)
GFR calc non Af Amer: >60 mL/min (ref >60)
GLUCOSE: 98 mg/dL (ref 65–99)
POTASSIUM: 3.6 mmol/L (ref 3.5–5.1)
Sodium: 140 mmol/L (ref 135–145)
TOTAL PROTEIN: 7.3 g/dL (ref 6.5–8.1)

## 2017-08-28 LAB — POC URINE PREG, ED: PREG TEST UR: NEGATIVE

## 2017-08-28 LAB — LIPASE, BLOOD: Lipase: 30 U/L (ref 11–51)

## 2017-08-28 MED ORDER — HYDROMORPHONE HCL 1 MG/ML IJ SOLN
0.5000 mg | Freq: Once | INTRAMUSCULAR | Status: AC
  Administered 2017-08-28: 0.5 mg via INTRAVENOUS
  Filled 2017-08-28: qty 1

## 2017-08-28 MED ORDER — OXYCODONE-ACETAMINOPHEN 5-325 MG PO TABS
1.0000 | ORAL_TABLET | Freq: Once | ORAL | Status: AC
  Administered 2017-08-28: 1 via ORAL
  Filled 2017-08-28: qty 1

## 2017-08-28 MED ORDER — MORPHINE SULFATE (PF) 4 MG/ML IV SOLN
4.0000 mg | Freq: Once | INTRAVENOUS | Status: DC
  Filled 2017-08-28: qty 1

## 2017-08-28 MED ORDER — SODIUM CHLORIDE 0.9 % IV BOLUS (SEPSIS)
1000.0000 mL | Freq: Once | INTRAVENOUS | Status: AC
  Administered 2017-08-28: 1000 mL via INTRAVENOUS

## 2017-08-28 MED ORDER — DEXTROSE 5 % IV SOLN
1.0000 g | Freq: Once | INTRAVENOUS | Status: AC
  Administered 2017-08-28: 1 g via INTRAVENOUS
  Filled 2017-08-28: qty 10

## 2017-08-28 MED ORDER — ONDANSETRON 4 MG PO TBDP
4.0000 mg | ORAL_TABLET | Freq: Once | ORAL | Status: AC | PRN
  Administered 2017-08-28: 4 mg via ORAL

## 2017-08-28 MED ORDER — ONDANSETRON 4 MG PO TBDP
ORAL_TABLET | ORAL | Status: AC
  Filled 2017-08-28: qty 1

## 2017-08-28 MED ORDER — HYDROMORPHONE HCL 2 MG PO TABS: 2.0000 mg | ORAL_TABLET | Freq: Once | ORAL | Status: DC

## 2017-08-28 MED ORDER — IOPAMIDOL (ISOVUE-300) INJECTION 61%
INTRAVENOUS | Status: AC
  Administered 2017-08-28: 100 mL
  Filled 2017-08-28: qty 100

## 2017-08-28 MED ORDER — METOCLOPRAMIDE HCL 5 MG/ML IJ SOLN
10.0000 mg | Freq: Once | INTRAMUSCULAR | Status: AC
  Administered 2017-08-28: 10 mg via INTRAVENOUS
  Filled 2017-08-28: qty 2

## 2017-08-28 MED ORDER — IPRATROPIUM-ALBUTEROL 0.5-2.5 (3) MG/3ML IN SOLN
3.0000 mL | Freq: Once | RESPIRATORY_TRACT | Status: AC
Start: 2017-08-28 — End: 2017-08-28
  Administered 2017-08-28: 3 mL via RESPIRATORY_TRACT
  Filled 2017-08-28: qty 3

## 2017-08-28 NOTE — ED Notes (Signed)
Patient transported to CT 

## 2017-08-28 NOTE — ED Notes (Signed)
Pt was able to stand and walk to the bathroom with assist to give a urine sample.

## 2017-08-28 NOTE — ED Provider Notes (Signed)
[facility]-EMERGENCY DEPT Provider Note   CSN: [id number] Arrival date & time: 08/28/17  1142     History   Chief Complaint Chief Complaint  Patient presents with  . Abdominal Pain    HPI Emily Whitehead is a 46 y.o. female.  HPI 46 year old Caucasian female past medical history significant for asthma presents to the emergency Department today with complaints of lower abdominal pain. Patient states her pain started 5 days ago and progressively worsened. States the pain is located in her diffuse lower abdomen.Patient states that "it's her ovaries". states the pain is constant and cramping in nature. Nothing makes better or worse. Patient denies any associated complaints of urinary symptoms, vaginal bleeding, vaginal discharge. Patient denies any fevers.endorses occasional nausea but denies any emesis. Patient has not taking anything for pain prior to arrival. Patient also states she has a history of asthma and states that she is out of her inhaler. Reports some mild wheezing. Patient does continue to use tobacco.  Pt denies any fever, chill, ha, vision changes, lightheadedness, dizziness, congestion, neck pain, cp, sob, cough, v/d, urinary symptoms, vaginal bleeding, vaginal discharge, change in bowel habits, melena, hematochezia, lower extremity paresthesias.   Past Medical History:  Diagnosis Date  . Asthma     Patient Active Problem List   Diagnosis Date Noted  . Chronic diarrhea 08/02/2016  . Morbid obesity (HCC) 08/01/2016  . GERD (gastroesophageal reflux disease) 08/01/2016  . Myalgia 08/01/2016  . Asthma, chronic 03/08/2016  . Chest pain 03/08/2016  . Norplant in place 03/08/2016  . Hot flashes 03/08/2016  . Neuropathy 03/08/2016  . Shoulder pain, right 08/05/2015  . Smoking 11/11/2014  . Lumbar transverse process fracture (HCC) 09/08/2014  . Right ankle sprain 09/08/2014    Past Surgical History:  Procedure Laterality Date  . DNC      OB History    No data  available       Home Medications    Prior to Admission medications   Medication Sig Start Date End Date Taking? Authorizing Provider  albendazole (ALBENZA) 200 MG tablet Take 2 tablets (400 mg total) by mouth 2 (two) times daily. Patient not taking: Reported on 08/28/2017 09/02/16   Dessa Phi, MD  albuterol (PROVENTIL HFA;VENTOLIN HFA) 108 (90 Base) MCG/ACT inhaler Inhale 2 puffs into the lungs every 4 (four) hours as needed for wheezing or shortness of breath. 08/01/16   Funches, Gerilyn Nestle, MD  albuterol (PROVENTIL HFA;VENTOLIN HFA) 108 (90 Base) MCG/ACT inhaler Inhale 2 puffs into the lungs every 4 (four) hours as needed for wheezing or shortness of breath. Patient not taking: Reported on 08/28/2017 05/08/17   Cathren Laine, MD  aspirin EC 81 MG tablet Take 1 tablet (81 mg total) by mouth daily. Patient not taking: Reported on 08/28/2017 08/01/16   Dessa Phi, MD  beclomethasone (QVAR) 40 MCG/ACT inhaler Inhale 2 puffs into the lungs 2 (two) times daily. Patient not taking: Reported on 08/26/2016 08/01/16   Dessa Phi, MD  beclomethasone (QVAR) 40 MCG/ACT inhaler Inhale 2 puffs into the lungs 2 (two) times daily. 08/26/16 08/28/16  Chilton Greathouse, MD  cetirizine (ZYRTEC) 10 MG tablet Take 1 tablet (10 mg total) by mouth daily. Patient not taking: Reported on 08/28/2017 08/01/16   Dessa Phi, MD  gabapentin (NEURONTIN) 300 MG capsule Take 1 capsule (300 mg total) by mouth 2 (two) times daily. Patient not taking: Reported on 08/26/2016 03/08/16   Dessa Phi, MD  ibuprofen (ADVIL,MOTRIN) 800 MG tablet Take 1 tablet (800 mg total) by  mouth every 8 (eight) hours as needed for headache, mild pain or moderate pain. Patient not taking: Reported on 08/28/2017 08/01/16   Dessa Phi, MD  nitroGLYCERIN (NITROSTAT) 0.4 MG SL tablet Place 1 tablet (0.4 mg total) under the tongue every 5 (five) minutes as needed for chest pain. Patient not taking: Reported on 08/28/2017 03/08/16   Dessa Phi, MD    omeprazole (PRILOSEC) 20 MG capsule Take 1 capsule (20 mg total) by mouth 2 (two) times daily before a meal. Patient not taking: Reported on 08/28/2017 08/01/16   Dessa Phi, MD  varenicline (CHANTIX CONTINUING MONTH PAK) 1 MG tablet Take 1 tablet (1 mg total) by mouth 2 (two) times daily. Patient not taking: Reported on 08/26/2016 03/08/16   Dessa Phi, MD  varenicline (CHANTIX STARTING MONTH PAK) 0.5 MG X 11 & 1 MG X 42 tablet Taper per packet insert Patient not taking: Reported on 08/26/2016 03/08/16   Dessa Phi, MD    Family History Family History  Problem Relation Age of Onset  . Cancer Mother        ovarian cancer     Social History Social History  Substance Use Topics  . Smoking status: Current Every Day Smoker    Packs/day: 0.25    Years: 25.00    Types: Cigarettes  . Smokeless tobacco: Never Used  . Alcohol use No     Allergies   Other and Aspirin   Review of Systems Review of Systems  Constitutional: Negative for chills and fever.  HENT: Negative for congestion.   Eyes: Negative for visual disturbance.  Respiratory: Positive for wheezing. Negative for cough and shortness of breath.   Cardiovascular: Negative for chest pain.  Gastrointestinal: Positive for abdominal pain and nausea. Negative for blood in stool, diarrhea and vomiting.  Genitourinary: Negative for dysuria, flank pain, frequency, hematuria, urgency, vaginal bleeding and vaginal discharge.  Musculoskeletal: Negative for arthralgias and myalgias.  Skin: Negative for rash.  Neurological: Negative for dizziness, syncope, weakness, light-headedness, numbness and headaches.  Psychiatric/Behavioral: Negative for sleep disturbance. The patient is not nervous/anxious.      Physical Exam Updated Vital Signs BP (!) 148/96   Pulse 79   Temp 98.2 F (36.8 C) (Oral)   Resp 18   Wt 136.1 kg (300 lb)   SpO2 99%   BMI 51.49 kg/m   Physical Exam  Constitutional: She is oriented to person,  place, and time. She appears well-developed and well-nourished.  Non-toxic appearance. No distress.  HENT:  Head: Normocephalic and atraumatic.  Nose: Nose normal.  Mouth/Throat: Oropharynx is clear and moist.  Eyes: Pupils are equal, round, and reactive to light. Conjunctivae are normal. Right eye exhibits no discharge. Left eye exhibits no discharge.  Neck: Normal range of motion. Neck supple.  Cardiovascular: Normal rate, regular rhythm, normal heart sounds and intact distal pulses.  Exam reveals no gallop and no friction rub.   No murmur heard. Pulmonary/Chest: Effort normal. No respiratory distress. She has wheezes. She has no rales. She exhibits no tenderness.  No rhonchi or crackles. Patient satting 100% on room air. No tachypnea noted.  Abdominal: Soft. Bowel sounds are normal. There is tenderness in the right lower quadrant and left lower quadrant. There is guarding. There is no rigidity, no rebound, no CVA tenderness, no tenderness at McBurney's point and negative Murphy's sign.  obese  Genitourinary:  Genitourinary Comments: Chaperone present for exam. No external lesions, swelling, erythema, or rash of the labia. No erythema, discharge, bleeding, or lesions  noted in the vaginal vault. No CMT tenderness, bleeding or friability. No adnexal tenderness, mass or fullness bilaterally. No inguinal adenopathy or hernia.    Musculoskeletal: Normal range of motion. She exhibits no tenderness.  Lymphadenopathy:    She has no cervical adenopathy.  Neurological: She is alert and oriented to person, place, and time.  Skin: Skin is warm and dry. Capillary refill takes less than 2 seconds.  Psychiatric: Her behavior is normal. Judgment and thought content normal.  Nursing note and vitals reviewed.    ED Treatments / Results  Labs (all labs ordered are listed, but only abnormal results are displayed) Labs Reviewed  COMPREHENSIVE METABOLIC PANEL - Abnormal; Notable for the following:        Result Value   Creatinine, Ser 1.01 (*)    All other components within normal limits  CBC - Abnormal; Notable for the following:    WBC 26.9 (*)    All other components within normal limits  URINALYSIS, ROUTINE W REFLEX MICROSCOPIC - Abnormal; Notable for the following:    Color, Urine AMBER (*)    APPearance TURBID (*)    Bilirubin Urine SMALL (*)    Protein, ur >=300 (*)    Leukocytes, UA TRACE (*)    Squamous Epithelial / LPF 0-5 (*)    All other components within normal limits  WET PREP, GENITAL  LIPASE, BLOOD  POC URINE PREG, ED  GC/CHLAMYDIA PROBE AMP ([place]) NOT AT [facility]    EKG  EKG Interpretation None       Radiology US Transvaginal Non-ob  Result Date: 08/28/2017 CLINICAL DATA:  Bilateral pelvic pain for 4 days EXAM: TRANSABDOMINAL AND TRANSVAGINAL ULTRASOUND OF PELVIS DOPPLER ULTRASOUND OF OVARIES TECHNIQUE: Both transabdominal and transvaginal ultrasound examinations of the pelvis were performed. Transabdominal technique was performed for global imaging of the pelvis including uterus, ovaries, adnexal regions, and pelvic cul-de-sac. It was necessary to proceed with endovaginal exam following the transabdominal exam to visualize the uterus endometrium and ovaries. Color and duplex Doppler ultrasound was utilized to evaluate blood flow to the ovaries. COMPARISON:  CT 09/08/2014 FINDINGS: Uterus Measurements: 7.3 x 3.8 x 4.7 cm. Submucosal right posterior fibroid measuring 1.7 x 1.7 x 1.7 cm. Nabothian cysts in the cervix. Endometrium Thickness: 2 mm.  No focal abnormality visualized. Right ovary Measurements: 2.1 x 1.6 x 1.6 cm. Normal appearance/no adnexal mass. Left ovary Measurements: 2.3 x 1.5 x 1.6 cm. Normal appearance/no adnexal mass. Pulsed Doppler evaluation of both ovaries demonstrates normal low-resistance arterial and venous waveforms. Other findings No abnormal free fluid. IMPRESSION: 1. Negative for ovarian torsion 2. Posterior submucosal fibroid.  Electronically Signed   By: Jasmine Pang M.D.   On: 08/28/2017 15:25   US Pelvis Complete  Result Date: 08/28/2017 CLINICAL DATA:  Bilateral pelvic pain for 4 days EXAM: TRANSABDOMINAL AND TRANSVAGINAL ULTRASOUND OF PELVIS DOPPLER ULTRASOUND OF OVARIES TECHNIQUE: Both transabdominal and transvaginal ultrasound examinations of the pelvis were performed. Transabdominal technique was performed for global imaging of the pelvis including uterus, ovaries, adnexal regions, and pelvic cul-de-sac. It was necessary to proceed with endovaginal exam following the transabdominal exam to visualize the uterus endometrium and ovaries. Color and duplex Doppler ultrasound was utilized to evaluate blood flow to the ovaries. COMPARISON:  CT 09/08/2014 FINDINGS: Uterus Measurements: 7.3 x 3.8 x 4.7 cm. Submucosal right posterior fibroid measuring 1.7 x 1.7 x 1.7 cm. Nabothian cysts in the cervix. Endometrium Thickness: 2 mm.  No focal abnormality visualized. Right ovary Measurements: 2.1 x  1.6 x 1.6 cm. Normal appearance/no adnexal mass. Left ovary Measurements: 2.3 x 1.5 x 1.6 cm. Normal appearance/no adnexal mass. Pulsed Doppler evaluation of both ovaries demonstrates normal low-resistance arterial and venous waveforms. Other findings No abnormal free fluid. IMPRESSION: 1. Negative for ovarian torsion 2. Posterior submucosal fibroid. Electronically Signed   By: Jasmine Pang M.D.   On: 08/28/2017 15:25   [place] Art/ven Flow Abd Pelv Doppler  Result Date: 08/28/2017 CLINICAL DATA:  Bilateral pelvic pain for 4 days EXAM: TRANSABDOMINAL AND TRANSVAGINAL ULTRASOUND OF PELVIS DOPPLER ULTRASOUND OF OVARIES TECHNIQUE: Both transabdominal and transvaginal ultrasound examinations of the pelvis were performed. Transabdominal technique was performed for global imaging of the pelvis including uterus, ovaries, adnexal regions, and pelvic cul-de-sac. It was necessary to proceed with endovaginal exam following the transabdominal exam to visualize  the uterus endometrium and ovaries. Color and duplex Doppler ultrasound was utilized to evaluate blood flow to the ovaries. COMPARISON:  CT 09/08/2014 FINDINGS: Uterus Measurements: 7.3 x 3.8 x 4.7 cm. Submucosal right posterior fibroid measuring 1.7 x 1.7 x 1.7 cm. Nabothian cysts in the cervix. Endometrium Thickness: 2 mm.  No focal abnormality visualized. Right ovary Measurements: 2.1 x 1.6 x 1.6 cm. Normal appearance/no adnexal mass. Left ovary Measurements: 2.3 x 1.5 x 1.6 cm. Normal appearance/no adnexal mass. Pulsed Doppler evaluation of both ovaries demonstrates normal low-resistance arterial and venous waveforms. Other findings No abnormal free fluid. IMPRESSION: 1. Negative for ovarian torsion 2. Posterior submucosal fibroid. Electronically Signed   By: Jasmine Pang M.D.   On: 08/28/2017 15:25    Procedures Procedures (including critical care time)  Medications Ordered in ED Medications  ondansetron (ZOFRAN-ODT) disintegrating tablet 4 mg (4 mg Oral Given 08/28/17 1157)  ipratropium-albuterol (DUONEB) 0.5-2.5 (3) MG/3ML nebulizer solution 3 mL (3 mLs Nebulization Given 08/28/17 1337)  HYDROmorphone (DILAUDID) injection 0.5 mg (0.5 mg Intravenous Given 08/28/17 1418)  iopamidol (ISOVUE-300) 61 % injection (100 mLs  Contrast Given 08/28/17 1535)     Initial Impression / Assessment and Plan / ED Course  I have reviewed the triage vital signs and the nursing notes.  Pertinent labs & imaging results that were available during my care of the patient were reviewed by me and considered in my medical decision making (see chart for details).     Patient resents to the ED with complaints of diffuse lower abdominal pain that has been progressively worsening over the past 2-3 days. Patient reports associated nausea but denies any associated fevers, chills, urinary symptoms, vaginal symptoms, diarrhea.patient also reports being out of her inhaler with some mild wheezing is requesting a refill of her  inhaler.Denies any associated chest pain, shortness breath, productive cough, fever.  Vital signs are reassuring. Patient is afebrile, no tachycardia noted. Patient with diffuse tenderness of the lower abdomen to palpation. Pelvic exam reveals no adnexal or cervical motion tenderness. Lungs with mild expiratory wheezes but no crackles or rhonchi noted. Patient satting 100% on room air.  Labs reveal leukocytosis of 26,000. All other labs unremarkable. UA shows no signs of infection. Protein noted in the urine. Will need close follow-up with PCP. Wet prep was unremarkable. Gonorrhea and chlamydia cultures are pending. Low suspicion at this time will not treat.  Ultrasound was obtained without any reproductive pathology. This was unremarkable except for a fibroid.  CT scan was then ordered to rule out abdominal pathology. Awaiting CT scan at this time. Ct scan reveals pyelonephritis. Will treat with abx given wbc and ct scan will treat with  ceftriaxone.  Care handoff to Resident Luciano CutterBlake Brigss. Pt has pending at this time Ct scan results and repeat exam.  Disposition likely home pending re eval. If pain is not controlled or patient not able tolerate by mouth fluids well likely need admission. Care dicussed and plan agreed upon with oncoming PA. Pt updated on plan of care and is currently hemodynamically stable at this time with normal vs.     Final Clinical Impressions(s) / ED Diagnoses   Final diagnoses:  None    New Prescriptions New Prescriptions   No medications on file     Wallace KellerLeaphart, Cletus Paris T, PA-C 08/28/17 1648    Tilden Fossaees, Elizabeth, MD 08/29/17 [phone]

## 2017-08-28 NOTE — ED Notes (Signed)
Pt verbalized understanding of d/c instructions and has no further questions. Pt is stable, A&Ox4, VSS.  

## 2017-08-28 NOTE — ED Notes (Signed)
Gave pt cranberry juice, per Ladona Ridgelaylor - RN.

## 2017-08-28 NOTE — ED Notes (Signed)
Gave pt pre-packed bag lunch ([place]urkey sandwich & applesauce) & cranberry juice, per Kandee Keenory - RN.

## 2017-08-28 NOTE — ED Notes (Signed)
Pt tolerating cranberry juice well. No nausea reported

## 2017-08-28 NOTE — ED Notes (Signed)
Pt taken to US

## 2017-08-28 NOTE — ED Triage Notes (Signed)
Pt reports LQ abd pain x 5 days. Pt also reports she ran out of inhaler and is sob at times. Endorses nausea. PT received 4mg  zofran pta.

## 2017-08-29 MED ORDER — CEPHALEXIN 500 MG PO CAPS: 500.0000 mg | ORAL_CAPSULE | Freq: Two times a day (BID) | ORAL | 0 refills | Status: AC

## 2017-08-29 NOTE — ED Provider Notes (Signed)
Called pt and left a message. Pt was not give abx script upon discharge yesterday evening. Left a voicemail stating that [facility] keflex TID for 10 days. Instructed pt that if she was not feeling improved or worsened to be eval by pcp or return to the Ed.    Rise MuLeaphart, Lakindra Wible T, PA-C 08/29/17 0948    Rise MuLeaphart, Carlos Quackenbush T, PA-C 08/29/17 1001    Tilden Fossaees, Elizabeth, MD 09/02/17 [phone]

## 2017-08-30 LAB — URINE CULTURE

## 2017-08-31 LAB — GC/CHLAMYDIA PROBE AMP (~~LOC~~) NOT AT ARMC
Chlamydia: NEGATIVE
Neisseria Gonorrhea: NEGATIVE

## 2017-12-11 ENCOUNTER — Encounter (HOSPITAL_COMMUNITY): Payer: Self-pay

## 2017-12-11 ENCOUNTER — Emergency Department (HOSPITAL_COMMUNITY): Payer: Self-pay

## 2017-12-11 ENCOUNTER — Emergency Department (HOSPITAL_COMMUNITY)
Admission: EM | Admit: 2017-12-11 | Discharge: 2017-12-11 | Disposition: A | Discharge status: Home / Self Care | Payer: Self-pay | Attending: Emergency Medicine | Admitting: Emergency Medicine

## 2017-12-11 DIAGNOSIS — Z7982 Long term (current) use of aspirin: Secondary | ICD-10-CM | POA: Insufficient documentation

## 2017-12-11 DIAGNOSIS — R1084 Generalized abdominal pain: Secondary | ICD-10-CM | POA: Insufficient documentation

## 2017-12-11 DIAGNOSIS — J45909 Unspecified asthma, uncomplicated: Secondary | ICD-10-CM | POA: Insufficient documentation

## 2017-12-11 DIAGNOSIS — R0789 Other chest pain: Secondary | ICD-10-CM | POA: Insufficient documentation

## 2017-12-11 DIAGNOSIS — E860 Dehydration: Secondary | ICD-10-CM | POA: Insufficient documentation

## 2017-12-11 DIAGNOSIS — F17210 Nicotine dependence, cigarettes, uncomplicated: Secondary | ICD-10-CM | POA: Insufficient documentation

## 2017-12-11 LAB — COMPREHENSIVE METABOLIC PANEL
ALBUMIN: 3.8 g/dL (ref 3.5–5.0)
ALT: 58 U/L — ABNORMAL HIGH (ref 14–54)
ANION GAP: 12 (ref 5–15)
AST: 54 U/L — ABNORMAL HIGH (ref 15–41)
Alkaline Phosphatase: 63 U/L (ref 38–126)
BUN: 14 mg/dL (ref 6–20)
CO2: 20 mmol/L — AB (ref 22–32)
Calcium: 9.3 mg/dL (ref 8.9–10.3)
Chloride: 106 mmol/L (ref 101–111)
Creatinine, Ser: 0.93 mg/dL (ref 0.44–1.00)
GFR calc Af Amer: >60 mL/min (ref >60)
GFR calc non Af Amer: >60 mL/min (ref >60)
GLUCOSE: 81 mg/dL (ref 65–99)
POTASSIUM: 3.5 mmol/L (ref 3.5–5.1)
SODIUM: 138 mmol/L (ref 135–145)
Total Bilirubin: 1.6 mg/dL — ABNORMAL HIGH (ref 0.3–1.2)
Total Protein: 6.7 g/dL (ref 6.5–8.1)

## 2017-12-11 LAB — URINALYSIS, ROUTINE W REFLEX MICROSCOPIC
Bilirubin Urine: NEGATIVE
GLUCOSE, UA: NEGATIVE mg/dL
Hgb urine dipstick: NEGATIVE
KETONES UR: 20 mg/dL — AB
LEUKOCYTES UA: NEGATIVE
Nitrite: NEGATIVE
PH: 6 (ref 5.0–8.0)
Protein, ur: NEGATIVE mg/dL

## 2017-12-11 LAB — TROPONIN I: Troponin I: <0.03 ng/mL (ref <0.03)

## 2017-12-11 LAB — CBC WITH DIFFERENTIAL/PLATELET
BASOS PCT: 0 %
Basophils Absolute: 0 10*3/uL (ref 0.0–0.1)
EOS ABS: 0.2 10*3/uL (ref 0.0–0.7)
Eosinophils Relative: 2 %
HCT: 41.7 % (ref 36.0–46.0)
HEMOGLOBIN: 14.1 g/dL (ref 12.0–15.0)
Lymphocytes Relative: 18 %
Lymphs Abs: 1.5 10*3/uL (ref 0.7–4.0)
MCH: 30.9 pg (ref 26.0–34.0)
MCHC: 33.8 g/dL (ref 30.0–36.0)
MCV: 91.4 fL (ref 78.0–100.0)
MONO ABS: 0.5 10*3/uL (ref 0.1–1.0)
MONOS PCT: 7 %
NEUTROS PCT: 73 %
Neutro Abs: 5.9 10*3/uL (ref 1.7–7.7)
Platelets: 196 10*3/uL (ref 150–400)
RBC: 4.56 MIL/uL (ref 3.87–5.11)
RDW: 13.2 % (ref 11.5–15.5)
WBC: 8.1 10*3/uL (ref 4.0–10.5)

## 2017-12-11 LAB — I-STAT BETA HCG BLOOD, ED (MC, WL, AP ONLY): I-stat hCG, quantitative: <5 m[IU]/mL (ref <5)

## 2017-12-11 MED ORDER — SODIUM CHLORIDE 0.9 % IV BOLUS (SEPSIS)
1000.0000 mL | Freq: Once | INTRAVENOUS | Status: AC
  Administered 2017-12-11: 1000 mL via INTRAVENOUS

## 2017-12-11 MED ORDER — IOPAMIDOL (ISOVUE-300) INJECTION 61%
INTRAVENOUS | Status: AC
  Administered 2017-12-11: 100 mL
  Filled 2017-12-11: qty 100

## 2017-12-11 MED ORDER — CEFTRIAXONE SODIUM 250 MG IJ SOLR
250.0000 mg | Freq: Once | INTRAMUSCULAR | Status: AC
Start: 2017-12-11 — End: 2017-12-11
  Administered 2017-12-11: 250 mg via INTRAMUSCULAR
  Filled 2017-12-11: qty 250

## 2017-12-11 MED ORDER — LIDOCAINE HCL (PF) 1 % IJ SOLN
INTRAMUSCULAR | Status: AC
  Filled 2017-12-11: qty 5

## 2017-12-11 NOTE — ED Notes (Signed)
Lactic acid 0.82
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
## 2017-12-11 NOTE — ED Notes (Signed)
Patient transported to X-ray 

## 2017-12-11 NOTE — ED Notes (Addendum)
Pt back from xray. Pt aware that we need a urine sample.

## 2017-12-11 NOTE — ED Notes (Signed)
EDPA in to speak with the patient.

## 2017-12-11 NOTE — ED Notes (Signed)
Pt states she has severe neurological symptoms X3 weeks. Stating objects that are not actually moving appear to be moving. MD notified. Pt AOX4. Moves all extremities, ambulatory. Pt refuses to provide a urine sample.

## 2017-12-11 NOTE — ED Provider Notes (Signed)
The patient believes that she has systemic infection and is very adamant about this.  Patient appears very anxious with pressured speech and talking very fast.  Patient has a normal lactic acid the patient's vital signs remained normal patient does not have any signs of fever or CT scan does not show any signs of infectious etiology in the abdomen.  Patient has laboratory testing that shows dehydration but no further issues.  Patient will be discharged home told to follow-up with her primary doctor.   Charlestine NightLawyer, Lashun Ramseyer, PA-C 12/11/17 1517    Gerhard MunchLockwood, Robert, MD 12/15/17 2118
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
## 2017-12-11 NOTE — ED Notes (Signed)
Pt request blood pulled from IV,   Notified nurse.

## 2017-12-11 NOTE — Discharge Instructions (Signed)
Follow-up with your primary doctor.  Your testing does indicate that she may have dehydration that is why we gave you the IV fluids.  At this point there is no signs of overwhelming infection based off of the lactic acid culture that we did.

## 2017-12-11 NOTE — ED Notes (Signed)
Pt ambulated to restroom from room, tolerated well. 

## 2017-12-11 NOTE — ED Notes (Signed)
Ambulated pt to the restroom to collect UA. She reports she got distracted in the restroom and forgot to collect a specimen.

## 2017-12-12 LAB — I-STAT CG4 LACTIC ACID, ED: Lactic Acid, Venous: 0.82 mmol/L (ref 0.5–1.9)

## 2018-03-18 ENCOUNTER — Emergency Department (HOSPITAL_COMMUNITY)
Admission: EM | Admit: 2018-03-18 | Discharge: 2018-03-18 | Discharge status: Against Medical Advice | Payer: Self-pay | Attending: Emergency Medicine | Admitting: Emergency Medicine

## 2018-03-18 ENCOUNTER — Ambulatory Visit (HOSPITAL_COMMUNITY): Payer: Self-pay

## 2018-03-18 ENCOUNTER — Encounter (HOSPITAL_COMMUNITY): Payer: Self-pay

## 2018-03-18 ENCOUNTER — Emergency Department (HOSPITAL_COMMUNITY)
Admission: EM | Admit: 2018-03-18 | Discharge: 2018-03-18 | Discharge status: Absent without leave | Payer: Self-pay | Attending: Emergency Medicine | Admitting: Emergency Medicine

## 2018-03-18 ENCOUNTER — Emergency Department (HOSPITAL_COMMUNITY): Payer: Self-pay

## 2018-03-18 DIAGNOSIS — J45909 Unspecified asthma, uncomplicated: Secondary | ICD-10-CM | POA: Insufficient documentation

## 2018-03-18 DIAGNOSIS — M791 Myalgia, unspecified site: Secondary | ICD-10-CM | POA: Insufficient documentation

## 2018-03-18 DIAGNOSIS — Z79899 Other long term (current) drug therapy: Secondary | ICD-10-CM | POA: Insufficient documentation

## 2018-03-18 DIAGNOSIS — F17210 Nicotine dependence, cigarettes, uncomplicated: Secondary | ICD-10-CM | POA: Insufficient documentation

## 2018-03-18 DIAGNOSIS — M542 Cervicalgia: Secondary | ICD-10-CM | POA: Insufficient documentation

## 2018-03-18 MED ORDER — AEROCHAMBER PLUS FLO-VU MEDIUM MISC: 1.0000 | Freq: Once | Status: DC

## 2018-03-18 MED ORDER — ACETAMINOPHEN 325 MG PO TABS: 650.0000 mg | ORAL_TABLET | Freq: Once | ORAL | Status: DC

## 2018-03-18 MED ORDER — ALBUTEROL SULFATE HFA 108 (90 BASE) MCG/ACT IN AERS: 1.0000 | INHALATION_SPRAY | Freq: Once | RESPIRATORY_TRACT | Status: DC

## 2018-03-18 NOTE — ED Notes (Signed)
Pt has not returned to room. Will remove from system at present time
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
## 2018-03-18 NOTE — ED Notes (Signed)
Alyssa PA aware of pt not present in room
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
## 2018-03-18 NOTE — Progress Notes (Signed)
Consult has been received. CSW attempting to follow up at present time.  Per RN, pt had left AMA.  Please reconsult if future social work needs arise.  CSW signing off, as social work intervention is no longer needed.  Dorothe PeaJonathan F. Brexlee Heberlein, LCSW, LCAS, CSI Clinical Social Worker Ph: [phone]

## 2018-03-18 NOTE — ED Notes (Signed)
Pt is not in room and has not been in room for several minutes. Went to get her for urine test and she was not present.
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
## 2018-03-28 ENCOUNTER — Encounter (HOSPITAL_COMMUNITY): Payer: Self-pay

## 2018-08-06 ENCOUNTER — Emergency Department (HOSPITAL_COMMUNITY): Payer: No Typology Code available for payment source

## 2018-08-06 ENCOUNTER — Emergency Department (HOSPITAL_COMMUNITY)
Admission: EM | Admit: 2018-08-06 | Discharge: 2018-08-06 | Disposition: A | Discharge status: Home / Self Care | Payer: No Typology Code available for payment source | Attending: Emergency Medicine | Admitting: Emergency Medicine

## 2018-08-06 ENCOUNTER — Encounter (HOSPITAL_COMMUNITY): Payer: Self-pay | Admitting: Emergency Medicine

## 2018-08-06 DIAGNOSIS — Z79899 Other long term (current) drug therapy: Secondary | ICD-10-CM | POA: Insufficient documentation

## 2018-08-06 DIAGNOSIS — F17210 Nicotine dependence, cigarettes, uncomplicated: Secondary | ICD-10-CM | POA: Insufficient documentation

## 2018-08-06 DIAGNOSIS — Z789 Other specified health status: Secondary | ICD-10-CM | POA: Insufficient documentation

## 2018-08-06 DIAGNOSIS — J45901 Unspecified asthma with (acute) exacerbation: Secondary | ICD-10-CM | POA: Insufficient documentation

## 2018-08-06 LAB — I-STAT CHEM 8, ED
BUN: 16 mg/dL (ref 6–20)
CHLORIDE: 102 mmol/L (ref 98–111)
Calcium, Ion: 1.02 mmol/L — ABNORMAL LOW (ref 1.15–1.40)
Creatinine, Ser: 0.8 mg/dL (ref 0.44–1.00)
GLUCOSE: 94 mg/dL (ref 70–99)
HEMATOCRIT: 42 % (ref 36.0–46.0)
HEMOGLOBIN: 14.3 g/dL (ref 12.0–15.0)
POTASSIUM: 3.4 mmol/L — AB (ref 3.5–5.1)
Sodium: 144 mmol/L (ref 135–145)
TCO2: 34 mmol/L — ABNORMAL HIGH (ref 22–32)

## 2018-08-06 MED ORDER — DIPHENHYDRAMINE HCL 50 MG/ML IJ SOLN
25.0000 mg | Freq: Once | INTRAMUSCULAR | Status: AC
  Administered 2018-08-06: 25 mg via INTRAVENOUS
  Filled 2018-08-06: qty 1

## 2018-08-06 MED ORDER — PREDNISONE 10 MG PO TABS: 20.0000 mg | ORAL_TABLET | Freq: Every day | ORAL | 0 refills | Status: DC

## 2018-08-06 MED ORDER — ALBUTEROL SULFATE HFA 108 (90 BASE) MCG/ACT IN AERS
2.0000 | INHALATION_SPRAY | RESPIRATORY_TRACT | Status: DC | PRN
  Administered 2018-08-06: 2 via RESPIRATORY_TRACT
  Filled 2018-08-06: qty 6.7

## 2018-08-06 MED ORDER — AEROCHAMBER PLUS FLO-VU LARGE MISC
1.0000 | Freq: Once | Status: AC
  Administered 2018-08-06: 1

## 2018-08-06 NOTE — ED Notes (Signed)
Pt removed herself from cardiac leads. Given ice and water per request.

## 2018-08-06 NOTE — ED Provider Notes (Signed)
[facility]   CSN: [id number] Arrival date & time: 08/06/18  1942     History   Chief Complaint Chief Complaint  Patient presents with  . Shortness of Breath    HPI Emily Whitehead is a 47 y.o. female.  HPI  47 year old female history of asthma presents today with dyspnea and wheezing.  She states that she has been out of her albuterol inhaler for the past month.  She intermittently has some wheezing and dyspnea.  She became worse today after exposure to grass.  By EMS she was given albuterol prehospital as well as to hold Solu-Medrol.  She is greatly improved on arrival here.  Denies any previous hospitalization or ICU admissions  Past Medical History:  Diagnosis Date  . Asthma     Patient Active Problem List   Diagnosis Date Noted  . Chronic diarrhea 08/02/2016  . Morbid obesity (HCC) 08/01/2016  . GERD (gastroesophageal reflux disease) 08/01/2016  . Myalgia 08/01/2016  . Asthma, chronic 03/08/2016  . Chest pain 03/08/2016  . Norplant in place 03/08/2016  . Hot flashes 03/08/2016  . Neuropathy 03/08/2016  . Shoulder pain, right 08/05/2015  . Smoking 11/11/2014  . Lumbar transverse process fracture (HCC) 09/08/2014  . Right ankle sprain 09/08/2014    Past Surgical History:  Procedure Laterality Date  . DNC       OB History   None      Home Medications    Prior to Admission medications   Medication Sig Start Date End Date Taking? Authorizing Provider  albendazole (ALBENZA) 200 MG tablet Take 2 tablets (400 mg total) by mouth 2 (two) times daily. Patient not taking: Reported on 08/28/2017 09/02/16   Dessa PhiFunches, Josalyn, MD  albuterol (PROVENTIL HFA;VENTOLIN HFA) 108 (90 Base) MCG/ACT inhaler Inhale 2 puffs into the lungs every 4 (four) hours as needed for wheezing or shortness of breath. 08/01/16   Funches, Gerilyn NestleJosalyn, MD  albuterol (PROVENTIL HFA;VENTOLIN HFA) 108 (90 Base) MCG/ACT inhaler Inhale 2 puffs  into the lungs every 4 (four) hours as needed for wheezing or shortness of breath. Patient not taking: Reported on 08/28/2017 05/08/17   Cathren LaineSteinl, Kevin, MD  aspirin EC 81 MG tablet Take 1 tablet (81 mg total) by mouth daily. Patient not taking: Reported on 08/28/2017 08/01/16   Dessa PhiFunches, Josalyn, MD  beclomethasone (QVAR) 40 MCG/ACT inhaler Inhale 2 puffs into the lungs 2 (two) times daily. Patient not taking: Reported on 08/26/2016 08/01/16   Dessa PhiFunches, Josalyn, MD  beclomethasone (QVAR) 40 MCG/ACT inhaler Inhale 2 puffs into the lungs 2 (two) times daily. 08/26/16 08/28/16  Mannam, Colbert CoyerPraveen, MD  cephALEXin (KEFLEX) 500 MG capsule Take 1 capsule (500 mg total) by mouth 2 (two) times daily. 08/29/17   Rise MuLeaphart, Kenneth T, PA-C  cetirizine (ZYRTEC) 10 MG tablet Take 1 tablet (10 mg total) by mouth daily. Patient not taking: Reported on 08/28/2017 08/01/16   Dessa PhiFunches, Josalyn, MD  gabapentin (NEURONTIN) 300 MG capsule Take 1 capsule (300 mg total) by mouth 2 (two) times daily. Patient not taking: Reported on 08/26/2016 03/08/16   Dessa PhiFunches, Josalyn, MD  ibuprofen (ADVIL,MOTRIN) 800 MG tablet Take 1 tablet (800 mg total) by mouth every 8 (eight) hours as needed for headache, mild pain or moderate pain. Patient not taking: Reported on 08/28/2017 08/01/16   Dessa PhiFunches, Josalyn, MD  nitroGLYCERIN (NITROSTAT) 0.4 MG SL tablet Place 1 tablet (0.4 mg total) under the tongue every 5 (five) minutes as needed for chest pain. Patient  not taking: Reported on 08/28/2017 03/08/16   Dessa PhiFunches, Josalyn, MD  omeprazole (PRILOSEC) 20 MG capsule Take 1 capsule (20 mg total) by mouth 2 (two) times daily before a meal. Patient not taking: Reported on 08/28/2017 08/01/16   Dessa PhiFunches, Josalyn, MD  varenicline (CHANTIX CONTINUING MONTH PAK) 1 MG tablet Take 1 tablet (1 mg total) by mouth 2 (two) times daily. Patient not taking: Reported on 08/26/2016 03/08/16   Dessa PhiFunches, Josalyn, MD  varenicline (CHANTIX STARTING MONTH PAK) 0.5 MG X 11 & 1 MG X 42 tablet Taper per packet  insert Patient not taking: Reported on 08/26/2016 03/08/16   Dessa PhiFunches, Josalyn, MD    Family History Family History  Problem Relation Age of Onset  . Cancer Mother        ovarian cancer     Social History Social History   Tobacco Use  . Smoking status: Current Every Day Smoker    Packs/day: 0.25    Years: 25.00    Pack years: 6.25    Types: Cigarettes  . Smokeless tobacco: Never Used  Substance Use Topics  . Alcohol use: No  . Drug use: No     Allergies   Other and Aspirin   Review of Systems Review of Systems  All other systems reviewed and are negative.    Physical Exam Updated Vital Signs BP 116/69   Pulse 99   Temp 99 F (37.2 C) (Oral)   Resp (!) 21   Ht 1.626 m (5\' 4" )   Wt 87.1 kg   SpO2 94%   BMI 32.96 kg/m   Physical Exam  Constitutional: She is oriented to person, place, and time. She appears well-developed and well-nourished.  HENT:  Head: Normocephalic and atraumatic.  Mouth/Throat: Oropharynx is clear and moist.  Eyes: Pupils are equal, round, and reactive to light. EOM are normal.  Neck: Normal range of motion. Neck supple.  Cardiovascular: Normal rate and regular rhythm.  Pulmonary/Chest: Tachypnea noted. She has wheezes in the left lower field. She has rales in the left lower field.  Abdominal: Soft. Bowel sounds are normal.  Musculoskeletal:       Right lower leg: Normal.       Left lower leg: Normal.  Neurological: She is alert and oriented to person, place, and time.  Skin: Skin is warm and dry. Capillary refill takes less than 2 seconds.  Psychiatric: She has a normal mood and affect.  Nursing note and vitals reviewed.    ED Treatments / Results  Labs (all labs ordered are listed, but only abnormal results are displayed) Labs Reviewed  I-STAT CHEM 8, ED - Abnormal; Notable for the following components:      Result Value   Potassium 3.4 (*)    Calcium, Ion 1.02 (*)    TCO2 34 (*)    All other components within normal limits     EKG None  Radiology Dg Chest Port 1 View  Result Date: 08/06/2018 CLINICAL DATA:  Chronic shortness of breath and wheezing. EXAM: PORTABLE CHEST 1 VIEW COMPARISON:  12/11/2017, 08/26/2016 and earlier. FINDINGS: Cardiomediastinal silhouette unremarkable, unchanged. Prominent bronchovascular markings diffusely and moderate central peribronchial thickening, more so than on the most recent prior examination. Lungs otherwise clear. No visible pleural effusions. IMPRESSION: Moderate changes of acute bronchitis and/or asthma without evidence of focal airspace pneumonia. Electronically Signed   By: Hulan Saashomas  Mcvey M.D.   On: 08/06/2018 20:13    Procedures Procedures (including critical care time)  Medications Ordered in ED Medications  diphenhydrAMINE (BENADRYL) injection 25 mg (25 mg Intravenous Given 08/06/18 1957)     Initial Impression / Assessment and Plan / ED Course  I have reviewed the triage vital signs and the nursing notes.  Pertinent labs & imaging results that were available during my care of the patient were reviewed by me and considered in my medical decision making (see chart for details).     47 yo female with cough and wheezing resolved here after nebulizer and steroids.  She will be given albuterol and steroids for op treatment.  Discussed return precautions and need for f/u and voices understanding. Final Clinical Impressions(s) / ED Diagnoses   Final diagnoses:  Moderate asthma with exacerbation, unspecified whether persistent  Has run out of medications    ED Discharge Orders    None       Margarita Grizzle, MD 08/06/18 2204

## 2018-08-06 NOTE — ED Notes (Signed)
Pt given apple juice  

## 2018-08-06 NOTE — ED Notes (Signed)
Pt ambulated to the bathroom w/o any issues, steady gate.

## 2018-08-06 NOTE — Discharge Instructions (Signed)
Use your inhaler up to 2 puffs every 3-4 hours Take all your prednisone Avoid tobacco Return if you having any difficulty breathing Please follow-up at your clinic

## 2018-08-06 NOTE — ED Triage Notes (Signed)
Pt reports shortness of breath "for a while", reports being out of home inhaler for several weeks. EMS gave 2 duonebs and 125mg  solumderol. Minimal wheezing upon arrival to ED. Alert and oriented x4,d no distress upon arrival.

## 2018-08-06 NOTE — ED Notes (Signed)
Pt in room talking to family, eating.  No respiratory distress noted.

## 2018-10-07 ENCOUNTER — Encounter (HOSPITAL_COMMUNITY): Payer: Self-pay | Admitting: Emergency Medicine

## 2018-10-07 ENCOUNTER — Emergency Department (HOSPITAL_COMMUNITY)
Admission: EM | Admit: 2018-10-07 | Discharge: 2018-10-07 | Disposition: A | Discharge status: Home / Self Care | Payer: Self-pay | Attending: Emergency Medicine | Admitting: Emergency Medicine

## 2018-10-07 ENCOUNTER — Emergency Department (HOSPITAL_COMMUNITY): Payer: Self-pay

## 2018-10-07 DIAGNOSIS — F17210 Nicotine dependence, cigarettes, uncomplicated: Secondary | ICD-10-CM | POA: Insufficient documentation

## 2018-10-07 DIAGNOSIS — J4541 Moderate persistent asthma with (acute) exacerbation: Secondary | ICD-10-CM | POA: Insufficient documentation

## 2018-10-07 MED ORDER — IPRATROPIUM BROMIDE 0.02 % IN SOLN
0.5000 mg | Freq: Once | RESPIRATORY_TRACT | Status: AC
  Administered 2018-10-07: 0.5 mg via RESPIRATORY_TRACT
  Filled 2018-10-07: qty 2.5

## 2018-10-07 MED ORDER — PREDNISONE 20 MG PO TABS: 40.0000 mg | ORAL_TABLET | Freq: Every day | ORAL | 0 refills | Status: AC

## 2018-10-07 MED ORDER — ALBUTEROL SULFATE (2.5 MG/3ML) 0.083% IN NEBU
5.0000 mg | INHALATION_SOLUTION | Freq: Once | RESPIRATORY_TRACT | Status: AC
  Administered 2018-10-07: 5 mg via RESPIRATORY_TRACT

## 2018-10-07 MED ORDER — PREDNISONE 20 MG PO TABS
60.0000 mg | ORAL_TABLET | Freq: Once | ORAL | Status: AC
  Administered 2018-10-07: 60 mg via ORAL
  Filled 2018-10-07: qty 3

## 2018-10-07 MED ORDER — ALBUTEROL SULFATE (2.5 MG/3ML) 0.083% IN NEBU
5.0000 mg | INHALATION_SOLUTION | Freq: Once | RESPIRATORY_TRACT | Status: DC
  Filled 2018-10-07: qty 6

## 2018-10-07 MED ORDER — ALBUTEROL SULFATE HFA 108 (90 BASE) MCG/ACT IN AERS: 2.0000 | INHALATION_SPRAY | RESPIRATORY_TRACT | 3 refills | Status: DC | PRN

## 2018-10-07 MED ORDER — ALBUTEROL SULFATE HFA 108 (90 BASE) MCG/ACT IN AERS
2.0000 | INHALATION_SPRAY | RESPIRATORY_TRACT | Status: DC
  Administered 2018-10-07: 2 via RESPIRATORY_TRACT
  Filled 2018-10-07: qty 6.7

## 2018-10-07 NOTE — ED Notes (Signed)
ED Provider at bedside. 

## 2018-10-07 NOTE — Discharge Instructions (Addendum)
Please stop smoking 

## 2018-10-07 NOTE — ED Notes (Signed)
Patient verbalizes understanding of discharge instructions. Opportunity for questioning and answers were provided. Armband removed by staff, pt discharged from ED ambulatory.   

## 2018-10-07 NOTE — ED Provider Notes (Signed)
[facility] EMERGENCY DEPARTMENT Provider Note   CSN: [id number] Arrival date & time: 10/07/18  1931     History   Chief Complaint Chief Complaint  Patient presents with  . Shortness of Breath    HPI Emily Whitehead is a 47 y.o. female.  HPI Patient is a 47 year old female with a history of asthma who continues to smoke cigarettes who presents the emergency department worsening wheezing and shortness of breath over the past several days.  She received bronchodilators by EMS with some improvement.  She still wheezing at this time.  Some cough.  No fevers or chills.  Denies chest pain.  No abdominal pain.  No unilateral leg swelling.  Symptoms are mild to moderate in severity.   Past Medical History:  Diagnosis Date  . Asthma     Patient Active Problem List   Diagnosis Date Noted  . Chronic diarrhea 08/02/2016  . Morbid obesity (HCC) 08/01/2016  . GERD (gastroesophageal reflux disease) 08/01/2016  . Myalgia 08/01/2016  . Asthma, chronic 03/08/2016  . Chest pain 03/08/2016  . Norplant in place 03/08/2016  . Hot flashes 03/08/2016  . Neuropathy 03/08/2016  . Shoulder pain, right 08/05/2015  . Smoking 11/11/2014  . Lumbar transverse process fracture (HCC) 09/08/2014  . Right ankle sprain 09/08/2014    Past Surgical History:  Procedure Laterality Date  . DNC       OB History   None      Home Medications    Prior to Admission medications   Medication Sig Start Date End Date Taking? Authorizing Provider  albendazole (ALBENZA) 200 MG tablet Take 2 tablets (400 mg total) by mouth 2 (two) times daily. Patient not taking: Reported on 08/28/2017 09/02/16   Dessa Phi, MD  albuterol (PROVENTIL HFA;VENTOLIN HFA) 108 (90 Base) MCG/ACT inhaler Inhale 2 puffs into the lungs every 4 (four) hours as needed for wheezing or shortness of breath. 10/07/18   Azalia Bilis, MD  beclomethasone (QVAR) 40 MCG/ACT inhaler Inhale 2 puffs into the lungs 2  (two) times daily. Patient not taking: Reported on 08/26/2016 08/01/16   Dessa Phi, MD  beclomethasone (QVAR) 40 MCG/ACT inhaler Inhale 2 puffs into the lungs 2 (two) times daily. 08/26/16 08/28/16  Mannam, Colbert Coyer, MD  cephALEXin (KEFLEX) 500 MG capsule Take 1 capsule (500 mg total) by mouth 2 (two) times daily. 08/29/17   Rise Mu, PA-C  cetirizine (ZYRTEC) 10 MG tablet Take 1 tablet (10 mg total) by mouth daily. Patient not taking: Reported on 08/28/2017 08/01/16   Dessa Phi, MD  gabapentin (NEURONTIN) 300 MG capsule Take 1 capsule (300 mg total) by mouth 2 (two) times daily. Patient not taking: Reported on 08/26/2016 03/08/16   Dessa Phi, MD  ibuprofen (ADVIL,MOTRIN) 800 MG tablet Take 1 tablet (800 mg total) by mouth every 8 (eight) hours as needed for headache, mild pain or moderate pain. Patient not taking: Reported on 08/28/2017 08/01/16   Dessa Phi, MD  nitroGLYCERIN (NITROSTAT) 0.4 MG SL tablet Place 1 tablet (0.4 mg total) under the tongue every 5 (five) minutes as needed for chest pain. Patient not taking: Reported on 08/28/2017 03/08/16   Dessa Phi, MD  omeprazole (PRILOSEC) 20 MG capsule Take 1 capsule (20 mg total) by mouth 2 (two) times daily before a meal. Patient not taking: Reported on 08/28/2017 08/01/16   Dessa Phi, MD  predniSONE (DELTASONE) 20 MG tablet Take 2 tablets (40 mg total) by mouth daily for 5 days. 10/07/18 10/12/18  Azalia Bilis, MD  varenicline (CHANTIX CONTINUING MONTH PAK) 1 MG tablet Take 1 tablet (1 mg total) by mouth 2 (two) times daily. Patient not taking: Reported on 08/26/2016 03/08/16   Dessa Phi, MD  varenicline (CHANTIX STARTING MONTH PAK) 0.5 MG X 11 & 1 MG X 42 tablet Taper per packet insert Patient not taking: Reported on 08/26/2016 03/08/16   Dessa Phi, MD    Family History Family History  Problem Relation Age of Onset  . Cancer Mother        ovarian cancer     Social History Social History   Tobacco  Use  . Smoking status: Current Every Day Smoker    Packs/day: 0.25    Years: 25.00    Pack years: 6.25    Types: Cigarettes  . Smokeless tobacco: Never Used  Substance Use Topics  . Alcohol use: No  . Drug use: No     Allergies   Other and Aspirin   Review of Systems Review of Systems  All other systems reviewed and are negative.    Physical Exam Updated Vital Signs Ht 5\' 5"  (1.651 m)   Wt 87 kg   SpO2 96%   BMI 31.92 kg/m   Physical Exam  Constitutional: She is oriented to person, place, and time. She appears well-developed and well-nourished. No distress.  HENT:  Head: Normocephalic and atraumatic.  Eyes: EOM are normal.  Neck: Normal range of motion.  Cardiovascular: Normal rate, regular rhythm and normal heart sounds.  Pulmonary/Chest: Effort normal. She has wheezes.  Abdominal: Soft. She exhibits no distension. There is no tenderness.  Musculoskeletal: Normal range of motion.  Neurological: She is alert and oriented to person, place, and time.  Skin: Skin is warm and dry.  Psychiatric: She has a normal mood and affect. Judgment normal.  Nursing note and vitals reviewed.    ED Treatments / Results  Labs (all labs ordered are listed, but only abnormal results are displayed) Labs Reviewed - No data to display  EKG None  Radiology Dg Chest 2 View  Result Date: 10/07/2018 CLINICAL DATA:  Patient with shortness of breath. EXAM: CHEST - 2 VIEW COMPARISON:  Chest radiograph 08/06/2018 FINDINGS: Monitoring leads overlie the patient. Normal cardiac and mediastinal contours. No consolidative pulmonary opacities. No pleural effusion or pneumothorax. Thoracic spine degenerative changes. IMPRESSION: No acute cardiopulmonary process. Electronically Signed   By: Annia Belt M.D.   On: 10/07/2018 21:16    Procedures Procedures (including critical care time)  Medications Ordered in ED Medications  albuterol (PROVENTIL) (2.5 MG/3ML) 0.083% nebulizer solution 5 mg  (5 mg Nebulization Not Given 10/07/18 2122)  albuterol (PROVENTIL HFA;VENTOLIN HFA) 108 (90 Base) MCG/ACT inhaler 2 puff (has no administration in time range)  predniSONE (DELTASONE) tablet 60 mg (60 mg Oral Given 10/07/18 2006)  albuterol (PROVENTIL) (2.5 MG/3ML) 0.083% nebulizer solution 5 mg (5 mg Nebulization Given 10/07/18 2032)  ipratropium (ATROVENT) nebulizer solution 0.5 mg (0.5 mg Nebulization Given 10/07/18 2007)     Initial Impression / Assessment and Plan / ED Course  I have reviewed the triage vital signs and the nursing notes.  Pertinent labs & imaging results that were available during my care of the patient were reviewed by me and considered in my medical decision making (see chart for details).     Feels much better after bronchodilators.  Steroids initiated.  Home with prednisone and albuterol.  Chest x-ray without infiltrate.  Patient is instructed to follow-up with the pulmonologist and her  primary care physician.  She understands return to the emergency department for new or worsening symptoms  Final Clinical Impressions(s) / ED Diagnoses   Final diagnoses:  Moderate persistent asthma with acute exacerbation    ED Discharge Orders         Ordered    predniSONE (DELTASONE) 20 MG tablet  Daily     10/07/18 2143    albuterol (PROVENTIL HFA;VENTOLIN HFA) 108 (90 Base) MCG/ACT inhaler  Every 4 hours PRN     10/07/18 2143           Azalia Bilis, MD 10/07/18 2145
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
## 2019-08-22 ENCOUNTER — Other Ambulatory Visit: Payer: Self-pay

## 2019-08-22 ENCOUNTER — Emergency Department (HOSPITAL_COMMUNITY)
Admission: EM | Admit: 2019-08-22 | Discharge: 2019-08-23 | Disposition: A | Discharge status: Home / Self Care | Payer: Self-pay | Attending: Emergency Medicine | Admitting: Emergency Medicine

## 2019-08-22 DIAGNOSIS — F17210 Nicotine dependence, cigarettes, uncomplicated: Secondary | ICD-10-CM | POA: Insufficient documentation

## 2019-08-22 DIAGNOSIS — W1789XA Other fall from one level to another, initial encounter: Secondary | ICD-10-CM | POA: Insufficient documentation

## 2019-08-22 DIAGNOSIS — R0602 Shortness of breath: Secondary | ICD-10-CM | POA: Insufficient documentation

## 2019-08-22 DIAGNOSIS — Z202 Contact with and (suspected) exposure to infections with a predominantly sexual mode of transmission: Secondary | ICD-10-CM | POA: Insufficient documentation

## 2019-08-22 DIAGNOSIS — R062 Wheezing: Secondary | ICD-10-CM

## 2019-08-22 DIAGNOSIS — M25561 Pain in right knee: Secondary | ICD-10-CM | POA: Insufficient documentation

## 2019-08-22 DIAGNOSIS — Z0441 Encounter for examination and observation following alleged adult rape: Secondary | ICD-10-CM | POA: Insufficient documentation

## 2019-08-22 DIAGNOSIS — M25521 Pain in right elbow: Secondary | ICD-10-CM | POA: Insufficient documentation

## 2019-08-22 DIAGNOSIS — J45909 Unspecified asthma, uncomplicated: Secondary | ICD-10-CM | POA: Insufficient documentation
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
## 2019-08-23 ENCOUNTER — Emergency Department (HOSPITAL_COMMUNITY): Payer: Self-pay

## 2019-08-23 ENCOUNTER — Ambulatory Visit (HOSPITAL_COMMUNITY)
Admission: EM | Admit: 2019-08-23 | Discharge: 2019-08-23 | Disposition: A | Discharge status: Home / Self Care | Payer: No Typology Code available for payment source | Attending: Emergency Medicine | Admitting: Emergency Medicine

## 2019-08-23 DIAGNOSIS — Z0441 Encounter for examination and observation following alleged adult rape: Secondary | ICD-10-CM | POA: Insufficient documentation

## 2019-08-23 MED ORDER — ALBUTEROL SULFATE HFA 108 (90 BASE) MCG/ACT IN AERS
2.0000 | INHALATION_SPRAY | RESPIRATORY_TRACT | Status: DC | PRN
  Filled 2019-08-23: qty 6.7

## 2019-08-23 MED ORDER — LIDOCAINE HCL (PF) 1 % IJ SOLN
0.9000 mL | Freq: Once | INTRAMUSCULAR | Status: AC
  Administered 2019-08-23: 0.9 mL
  Filled 2019-08-23: qty 5

## 2019-08-23 MED ORDER — ACETAMINOPHEN 500 MG PO TABS
1000.0000 mg | ORAL_TABLET | Freq: Once | ORAL | Status: AC
  Administered 2019-08-23: 1000 mg via ORAL
  Filled 2019-08-23: qty 2

## 2019-08-23 MED ORDER — AZITHROMYCIN 250 MG PO TABS
1000.0000 mg | ORAL_TABLET | Freq: Once | ORAL | Status: AC
  Administered 2019-08-23: 1000 mg via ORAL
  Filled 2019-08-23: qty 4

## 2019-08-23 MED ORDER — CEFTRIAXONE SODIUM 250 MG IJ SOLR
250.0000 mg | Freq: Once | INTRAMUSCULAR | Status: AC
  Administered 2019-08-23: 06:00:00 250 mg via INTRAMUSCULAR
  Filled 2019-08-23: qty 250

## 2019-08-23 MED ORDER — TETANUS-DIPHTH-ACELL PERTUSSIS 5-2.5-18.5 LF-MCG/0.5 IM SUSP
0.5000 mL | Freq: Once | INTRAMUSCULAR | Status: AC
  Administered 2019-08-23: 0.5 mL via INTRAMUSCULAR
  Filled 2019-08-23: qty 0.5

## 2019-08-23 MED ORDER — AEROCHAMBER PLUS FLO-VU LARGE MISC: 1.0000 | Freq: Once | Status: DC
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
## 2019-08-23 NOTE — Discharge Instructions (Addendum)
1. Medications: albuterol MDI, usual home medications 2. Treatment: rest, drink plenty of fluids,  3. Follow Up: Please followup with your primary doctor as needed for further evaluation after today's visit; Please return to the ER as needed for new or worsening symptoms.
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
## 2019-08-23 NOTE — ED Notes (Signed)
Pt only c/o abrasion to bil knees and right elbow.

## 2019-10-24 ENCOUNTER — Encounter (HOSPITAL_COMMUNITY): Payer: Self-pay | Admitting: Emergency Medicine

## 2019-10-24 ENCOUNTER — Emergency Department (HOSPITAL_COMMUNITY)
Admission: EM | Admit: 2019-10-24 | Discharge: 2019-10-24 | Disposition: A | Discharge status: Home / Self Care | Payer: Self-pay | Attending: Emergency Medicine | Admitting: Emergency Medicine

## 2019-10-24 ENCOUNTER — Emergency Department (HOSPITAL_COMMUNITY): Payer: Self-pay

## 2019-10-24 ENCOUNTER — Other Ambulatory Visit: Payer: Self-pay

## 2019-10-24 DIAGNOSIS — J4541 Moderate persistent asthma with (acute) exacerbation: Secondary | ICD-10-CM | POA: Insufficient documentation

## 2019-10-24 DIAGNOSIS — F17210 Nicotine dependence, cigarettes, uncomplicated: Secondary | ICD-10-CM | POA: Insufficient documentation

## 2019-10-24 LAB — BASIC METABOLIC PANEL
Anion gap: 12 (ref 5–15)
BUN: 7 mg/dL (ref 6–20)
CO2: 23 mmol/L (ref 22–32)
Calcium: 8.5 mg/dL — ABNORMAL LOW (ref 8.9–10.3)
Chloride: 106 mmol/L (ref 98–111)
Creatinine, Ser: 0.93 mg/dL (ref 0.44–1.00)
GFR calc Af Amer: >60 mL/min (ref >60)
GFR calc non Af Amer: >60 mL/min (ref >60)
Glucose, Bld: 91 mg/dL (ref 70–99)
Potassium: 3.8 mmol/L (ref 3.5–5.1)
Sodium: 141 mmol/L (ref 135–145)

## 2019-10-24 LAB — CBC WITH DIFFERENTIAL/PLATELET
Abs Immature Granulocytes: 0.02 10*3/uL (ref 0.00–0.07)
Basophils Absolute: 0 10*3/uL (ref 0.0–0.1)
Basophils Relative: 0 %
Eosinophils Absolute: 0.2 10*3/uL (ref 0.0–0.5)
Eosinophils Relative: 2 %
HCT: 40.5 % (ref 36.0–46.0)
Hemoglobin: 13.8 g/dL (ref 12.0–15.0)
Immature Granulocytes: 0 %
Lymphocytes Relative: 21 %
Lymphs Abs: 1.6 10*3/uL (ref 0.7–4.0)
MCH: 31.2 pg (ref 26.0–34.0)
MCHC: 34.1 g/dL (ref 30.0–36.0)
MCV: 91.6 fL (ref 80.0–100.0)
Monocytes Absolute: 0.6 10*3/uL (ref 0.1–1.0)
Monocytes Relative: 7 %
Neutro Abs: 5.2 10*3/uL (ref 1.7–7.7)
Neutrophils Relative %: 70 %
Platelets: 171 10*3/uL (ref 150–400)
RBC: 4.42 MIL/uL (ref 3.87–5.11)
RDW: 13.6 % (ref 11.5–15.5)
WBC: 7.5 10*3/uL (ref 4.0–10.5)
nRBC: 0 % (ref 0.0–0.2)

## 2019-10-24 LAB — COOXEMETRY PANEL
Carboxyhemoglobin: 4.6 % — ABNORMAL HIGH (ref 0.5–1.5)
Methemoglobin: 1 % (ref 0.0–1.5)
O2 Saturation: 99.1 %
Total hemoglobin: 14.1 g/dL (ref 12.0–16.0)

## 2019-10-24 MED ORDER — ALBUTEROL SULFATE HFA 108 (90 BASE) MCG/ACT IN AERS
8.0000 | INHALATION_SPRAY | Freq: Once | RESPIRATORY_TRACT | Status: AC
  Administered 2019-10-24: 8 via RESPIRATORY_TRACT
  Filled 2019-10-24: qty 6.7

## 2019-10-24 MED ORDER — ALBUTEROL SULFATE HFA 108 (90 BASE) MCG/ACT IN AERS: 2.0000 | INHALATION_SPRAY | RESPIRATORY_TRACT | 0 refills | Status: AC | PRN
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
## 2019-10-24 NOTE — ED Notes (Signed)
Patient verbalizes understanding of discharge instructions. Opportunity for questioning and answers were provided. Armband removed by staff, pt discharged from ED.  
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
## 2020-06-25 DEATH — deceased
